# Patient Record
Sex: Male | Born: 1935 | Race: White | Hispanic: No | Smoking: Former smoker
Health system: Southern US, Community
[De-identification: ages and names within clinical notes are randomized; demographics above are authoritative.]

## PROBLEM LIST (undated history)

## (undated) DIAGNOSIS — J449 Chronic obstructive pulmonary disease, unspecified: Secondary | ICD-10-CM

## (undated) DIAGNOSIS — I429 Cardiomyopathy, unspecified: Secondary | ICD-10-CM

## (undated) DIAGNOSIS — N400 Enlarged prostate without lower urinary tract symptoms: Secondary | ICD-10-CM

## (undated) DIAGNOSIS — E119 Type 2 diabetes mellitus without complications: Secondary | ICD-10-CM

## (undated) DIAGNOSIS — I2510 Atherosclerotic heart disease of native coronary artery without angina pectoris: Secondary | ICD-10-CM

## (undated) HISTORY — PX: HERNIA REPAIR: SHX51

## (undated) HISTORY — PX: BACK SURGERY: SHX140

## (undated) HISTORY — PX: CHOLECYSTECTOMY: SHX55

---

## 2013-12-23 ENCOUNTER — Inpatient Hospital Stay (HOSPITAL_COMMUNITY)
Admission: EM | Admit: 2013-12-23 | Discharge: 2014-01-02 | DRG: 190 | Disposition: A | Discharge status: Home / Self Care | Payer: Medicare Other | Attending: Internal Medicine | Admitting: Internal Medicine

## 2013-12-23 ENCOUNTER — Encounter (HOSPITAL_COMMUNITY): Payer: Self-pay | Admitting: Emergency Medicine

## 2013-12-23 ENCOUNTER — Emergency Department (HOSPITAL_COMMUNITY): Payer: Medicare Other

## 2013-12-23 DIAGNOSIS — J9601 Acute respiratory failure with hypoxia: Secondary | ICD-10-CM

## 2013-12-23 DIAGNOSIS — I252 Old myocardial infarction: Secondary | ICD-10-CM

## 2013-12-23 DIAGNOSIS — E872 Acidosis, unspecified: Secondary | ICD-10-CM | POA: Diagnosis present

## 2013-12-23 DIAGNOSIS — D649 Anemia, unspecified: Secondary | ICD-10-CM | POA: Diagnosis present

## 2013-12-23 DIAGNOSIS — J111 Influenza due to unidentified influenza virus with other respiratory manifestations: Secondary | ICD-10-CM | POA: Diagnosis present

## 2013-12-23 DIAGNOSIS — R7989 Other specified abnormal findings of blood chemistry: Secondary | ICD-10-CM

## 2013-12-23 DIAGNOSIS — F3289 Other specified depressive episodes: Secondary | ICD-10-CM | POA: Diagnosis present

## 2013-12-23 DIAGNOSIS — R197 Diarrhea, unspecified: Secondary | ICD-10-CM | POA: Diagnosis present

## 2013-12-23 DIAGNOSIS — Z87891 Personal history of nicotine dependence: Secondary | ICD-10-CM

## 2013-12-23 DIAGNOSIS — E876 Hypokalemia: Secondary | ICD-10-CM | POA: Diagnosis present

## 2013-12-23 DIAGNOSIS — J441 Chronic obstructive pulmonary disease with (acute) exacerbation: Secondary | ICD-10-CM

## 2013-12-23 DIAGNOSIS — J9600 Acute respiratory failure, unspecified whether with hypoxia or hypercapnia: Secondary | ICD-10-CM | POA: Diagnosis present

## 2013-12-23 DIAGNOSIS — J449 Chronic obstructive pulmonary disease, unspecified: Secondary | ICD-10-CM

## 2013-12-23 DIAGNOSIS — F32A Depression, unspecified: Secondary | ICD-10-CM

## 2013-12-23 DIAGNOSIS — IMO0001 Reserved for inherently not codable concepts without codable children: Secondary | ICD-10-CM | POA: Diagnosis present

## 2013-12-23 DIAGNOSIS — R0902 Hypoxemia: Secondary | ICD-10-CM

## 2013-12-23 DIAGNOSIS — E119 Type 2 diabetes mellitus without complications: Secondary | ICD-10-CM

## 2013-12-23 DIAGNOSIS — J101 Influenza due to other identified influenza virus with other respiratory manifestations: Secondary | ICD-10-CM | POA: Diagnosis present

## 2013-12-23 DIAGNOSIS — I10 Essential (primary) hypertension: Secondary | ICD-10-CM

## 2013-12-23 DIAGNOSIS — I493 Ventricular premature depolarization: Secondary | ICD-10-CM

## 2013-12-23 DIAGNOSIS — E785 Hyperlipidemia, unspecified: Secondary | ICD-10-CM | POA: Diagnosis present

## 2013-12-23 DIAGNOSIS — I509 Heart failure, unspecified: Secondary | ICD-10-CM | POA: Diagnosis present

## 2013-12-23 DIAGNOSIS — N400 Enlarged prostate without lower urinary tract symptoms: Secondary | ICD-10-CM | POA: Diagnosis present

## 2013-12-23 DIAGNOSIS — J209 Acute bronchitis, unspecified: Principal | ICD-10-CM | POA: Diagnosis present

## 2013-12-23 DIAGNOSIS — I428 Other cardiomyopathies: Secondary | ICD-10-CM | POA: Diagnosis present

## 2013-12-23 DIAGNOSIS — Z7982 Long term (current) use of aspirin: Secondary | ICD-10-CM

## 2013-12-23 DIAGNOSIS — E1165 Type 2 diabetes mellitus with hyperglycemia: Secondary | ICD-10-CM

## 2013-12-23 DIAGNOSIS — F329 Major depressive disorder, single episode, unspecified: Secondary | ICD-10-CM | POA: Diagnosis present

## 2013-12-23 DIAGNOSIS — R778 Other specified abnormalities of plasma proteins: Secondary | ICD-10-CM | POA: Diagnosis present

## 2013-12-23 DIAGNOSIS — J440 Chronic obstructive pulmonary disease with acute lower respiratory infection: Principal | ICD-10-CM | POA: Diagnosis present

## 2013-12-23 DIAGNOSIS — I169 Hypertensive crisis, unspecified: Secondary | ICD-10-CM | POA: Diagnosis present

## 2013-12-23 DIAGNOSIS — D696 Thrombocytopenia, unspecified: Secondary | ICD-10-CM | POA: Diagnosis present

## 2013-12-23 DIAGNOSIS — I5043 Acute on chronic combined systolic (congestive) and diastolic (congestive) heart failure: Secondary | ICD-10-CM | POA: Diagnosis not present

## 2013-12-23 DIAGNOSIS — T380X5A Adverse effect of glucocorticoids and synthetic analogues, initial encounter: Secondary | ICD-10-CM | POA: Diagnosis present

## 2013-12-23 DIAGNOSIS — R0602 Shortness of breath: Secondary | ICD-10-CM

## 2013-12-23 HISTORY — DX: Cardiomyopathy, unspecified: I42.9

## 2013-12-23 HISTORY — DX: Benign prostatic hyperplasia without lower urinary tract symptoms: N40.0

## 2013-12-23 HISTORY — DX: Atherosclerotic heart disease of native coronary artery without angina pectoris: I25.10

## 2013-12-23 HISTORY — DX: Type 2 diabetes mellitus without complications: E11.9

## 2013-12-23 HISTORY — DX: Chronic obstructive pulmonary disease, unspecified: J44.9

## 2013-12-23 LAB — CBC WITH DIFFERENTIAL/PLATELET
Basophils Absolute: 0 10*3/uL (ref 0.0–0.1)
Basophils Relative: 0 % (ref 0–1)
Eosinophils Absolute: 0 10*3/uL (ref 0.0–0.7)
Eosinophils Relative: 0 % (ref 0–5)
HEMATOCRIT: 46 % (ref 39.0–52.0)
Hemoglobin: 15.3 g/dL (ref 13.0–17.0)
LYMPHS ABS: 0.9 10*3/uL (ref 0.7–4.0)
Lymphocytes Relative: 12 % (ref 12–46)
MCH: 28.3 pg (ref 26.0–34.0)
MCHC: 33.3 g/dL (ref 30.0–36.0)
MCV: 85.2 fL (ref 78.0–100.0)
MONOS PCT: 5 % (ref 3–12)
Monocytes Absolute: 0.3 10*3/uL (ref 0.1–1.0)
NEUTROS ABS: 6.1 10*3/uL (ref 1.7–7.7)
NEUTROS PCT: 84 % — AB (ref 43–77)
Platelets: 140 10*3/uL — ABNORMAL LOW (ref 150–400)
RBC: 5.4 MIL/uL (ref 4.22–5.81)
RDW: 12.8 % (ref 11.5–15.5)
WBC: 7.4 10*3/uL (ref 4.0–10.5)

## 2013-12-23 LAB — COMPREHENSIVE METABOLIC PANEL
ALBUMIN: 3.7 g/dL (ref 3.5–5.2)
ALT: 14 U/L (ref 0–53)
AST: 19 U/L (ref 0–37)
Alkaline Phosphatase: 81 U/L (ref 39–117)
BILIRUBIN TOTAL: 0.5 mg/dL (ref 0.3–1.2)
BUN: 14 mg/dL (ref 6–23)
CHLORIDE: 99 meq/L (ref 96–112)
CO2: 26 mEq/L (ref 19–32)
Calcium: 9.4 mg/dL (ref 8.4–10.5)
Creatinine, Ser: 0.78 mg/dL (ref 0.50–1.35)
GFR, EST NON AFRICAN AMERICAN: 84 mL/min — AB (ref >90)
GLUCOSE: 302 mg/dL — AB (ref 70–99)
Potassium: 4.4 mEq/L (ref 3.7–5.3)
Sodium: 142 mEq/L (ref 137–147)
Total Protein: 7.7 g/dL (ref 6.0–8.3)

## 2013-12-23 LAB — TROPONIN I: TROPONIN I: 0.59 ng/mL — AB (ref <0.30)

## 2013-12-23 LAB — GLUCOSE, CAPILLARY: Glucose-Capillary: 263 mg/dL — ABNORMAL HIGH (ref 70–99)

## 2013-12-23 LAB — CG4 I-STAT (LACTIC ACID): LACTIC ACID, VENOUS: 3.85 mmol/L — AB (ref 0.5–2.2)

## 2013-12-23 LAB — POCT I-STAT TROPONIN I: Troponin i, poc: 0.21 ng/mL (ref 0.00–0.08)

## 2013-12-23 MED ORDER — ALBUTEROL (5 MG/ML) CONTINUOUS INHALATION SOLN
20.0000 mg | INHALATION_SOLUTION | Freq: Once | RESPIRATORY_TRACT | Status: AC
Start: 2013-12-23 — End: 2013-12-23
  Administered 2013-12-23: 20 mg via RESPIRATORY_TRACT

## 2013-12-23 MED ORDER — INSULIN ASPART 100 UNIT/ML ~~LOC~~ SOLN
5.0000 [IU] | Freq: Once | SUBCUTANEOUS | Status: AC
  Administered 2013-12-23: 5 [IU] via SUBCUTANEOUS
  Filled 2013-12-23: qty 1

## 2013-12-23 MED ORDER — SODIUM CHLORIDE 0.9 % IV BOLUS (SEPSIS)
2000.0000 mL | Freq: Once | INTRAVENOUS | Status: AC
  Administered 2013-12-23: 2000 mL via INTRAVENOUS

## 2013-12-23 MED ORDER — ALBUTEROL (5 MG/ML) CONTINUOUS INHALATION SOLN
INHALATION_SOLUTION | RESPIRATORY_TRACT | Status: AC
  Filled 2013-12-23: qty 20

## 2013-12-23 MED ORDER — SODIUM CHLORIDE 0.9 % IV BOLUS (SEPSIS)
500.0000 mL | Freq: Once | INTRAVENOUS | Status: AC
  Administered 2013-12-23: 500 mL via INTRAVENOUS

## 2013-12-23 MED ORDER — IPRATROPIUM BROMIDE 0.02 % IN SOLN
0.5000 mg | Freq: Once | RESPIRATORY_TRACT | Status: AC
  Administered 2013-12-23: 0.5 mg via RESPIRATORY_TRACT
  Filled 2013-12-23: qty 2.5

## 2013-12-23 MED ORDER — METHYLPREDNISOLONE SODIUM SUCC 125 MG IJ SOLR
125.0000 mg | Freq: Once | INTRAMUSCULAR | Status: AC
  Administered 2013-12-23: 125 mg via INTRAVENOUS
  Filled 2013-12-23: qty 2

## 2013-12-23 NOTE — ED Notes (Signed)
Continuous neb treatment finished, aerosol mask removed and patient placed on 3 liters of oxygen via nasal canula.

## 2013-12-23 NOTE — H&P (Addendum)
[facility] History and Physical  Seth Hunt WOE:[id number] DOB: 16-Aug-1933 DOA: 12/23/2013  Referring physician:  PCP: [name], MD  Specialists:   Chief Complaint: SOB, cough   HPI: Seth Hunt is a 80 y.o. male with DM, HTN, HPL, COPD presented with SOB, non productive cough, associated with DOE, chills after recent URI symptoms; patient found to have mild hypoxia in ED which improved after inhalers+oxygen; denies any chest pain, no palpitations, no dizziness, no diaphoresis, no nausea, vomiting or diarrhea;   Review of Systems: The patient denies anorexia, fever, weight loss,, vision loss, decreased hearing, hoarseness, chest pain, syncope,  balance deficits, hemoptysis, abdominal pain, melena, hematochezia, severe indigestion/heartburn, hematuria, incontinence, genital sores, muscle weakness, suspicious skin lesions, transient blindness, difficulty walking, depression, unusual weight change, abnormal bleeding, enlarged lymph nodes, angioedema, and breast masses.    Past Medical History  Diagnosis Date  . COPD (chronic obstructive pulmonary disease)   . Diabetes mellitus without complication    Past Surgical History  Procedure Laterality Date  . Back surgery    . Cholecystectomy    . Hernia repair     Social History:  reports that he has quit smoking. He does not have any smokeless tobacco history on file. He reports that he does not drink alcohol or use illicit drugs. Home;  where does patient live--home, ALF, SNF? and with whom if at home? Yes;  Can patient participate in ADLs?  No Known Allergies  No family history on file. no h/o CAD (be sure to complete)  Prior to Admission medications   Medication Sig Start Date End Date Taking? Authorizing Provider  aspirin EC 81 MG tablet Take 81 mg by mouth daily.   Yes Historical Provider, MD  FLUoxetine (PROZAC) 20 MG capsule Take 20 mg by mouth every morning.   Yes Historical Provider, MD  glimepiride (AMARYL) 4 MG  tablet Take 4 mg by mouth daily.   Yes Historical Provider, MD  metFORMIN (GLUCOPHAGE) 1000 MG tablet Take 1,000 mg by mouth 2 (two) times daily.   Yes Historical Provider, MD  metoprolol tartrate (LOPRESSOR) 25 MG tablet Take 25 mg by mouth 2 (two) times daily.   Yes Historical Provider, MD  ramipril (ALTACE) 10 MG capsule Take 10 mg by mouth daily.   Yes Historical Provider, MD  simvastatin (ZOCOR) 40 MG tablet Take 40 mg by mouth at bedtime.   Yes Historical Provider, MD  tamsulosin (FLOMAX) 0.4 MG CAPS capsule Take 0.4 mg by mouth every morning.    Yes Historical Provider, MD   Physical Exam: Filed Vitals:   12/23/13 2100  BP: 135/65  Pulse: 104  Temp:   Resp: 26     General:  alert  Eyes: EOM-I, perrla   ENT: no oral ulcers   Neck: supple, no JVD  Cardiovascular: s1,s2 rrr  Respiratory: LL wheezing, poor ventilation LL  Abdomen: soft, nt, nd   Skin: no rash  Musculoskeletal: no LE edema  Psychiatric: no hallucinations   Neurologic: CN 2-12 intact, motor 5/5 BL  Labs on Admission:  Basic Metabolic Panel:  Recent Labs Lab 12/23/13 1914  NA 142  K 4.4  CL 99  CO2 26  GLUCOSE 302*  BUN 14  CREATININE 0.78  CALCIUM 9.4   Liver Function Tests:  Recent Labs Lab 12/23/13 1914  AST 19  ALT 14  ALKPHOS 81  BILITOT 0.5  PROT 7.7  ALBUMIN 3.7   No results found for this basename: LIPASE, AMYLASE,  in the last 168  hours No results found for this basename: AMMONIA,  in the last 168 hours CBC:  Recent Labs Lab 12/23/13 1914  WBC 7.4  NEUTROABS 6.1  HGB 15.3  HCT 46.0  MCV 85.2  PLT 140*   Cardiac Enzymes: No results found for this basename: CKTOTAL, CKMB, CKMBINDEX, TROPONINI,  in the last 168 hours  BNP (last 3 results) No results found for this basename: PROBNP,  in the last 8760 hours CBG:  Recent Labs Lab 12/23/13 2140  GLUCAP 263*    Radiological Exams on Admission: Dg Chest Port 1 View  12/23/2013   CLINICAL DATA:  Shortness  of breath  EXAM: PORTABLE CHEST - 1 VIEW  COMPARISON:  None.  FINDINGS: The cardiac shadow is within normal limits. The lungs are well aerated bilaterally. No focal infiltrate or sizable effusion is seen.  IMPRESSION: No acute abnormality noted.   Electronically Signed   By: Alcide CleverMark  Lukens M.D.   On: 12/23/2013 19:09    EKG: Independently reviewed. probable MAT on initial ECG then NSR, non specifics t wave changes    Assessment/Plan Active Problems:   COPD (chronic obstructive pulmonary disease)   DM (diabetes mellitus)   HTN (hypertension)   SOB (shortness of breath)  80 y.o. male with DM, HTN, HPL, COPD presented with SOB, non productive cough, associated with DOE, chills after recent URI symptoms;  1. COPD exacerbation; CXR: no clear infiltrate; symptoms improved after inhalers+oxygen -started IV steroids, ATX, bronchodilators, oxygen;   2. Positive initial trop; no active chest pain r/o NSTEMI  -per patient LHC (2014): no coronary artery disease at [facility]  -monitor tele r/o arrythmia, start IV hep, cont ASA, BB, statins; obtain echo; if trop neg d/c heparin; need cardiology eval;'  -obtain records from [facility]   3. HTN resume home meds; titrate per response   4. DM no recent HA1C;  -hold PO meds; ISS while inpatient; check HA1C   None;  if consultant consulted, please document name and whether formally or informally consulted  Code Status: full;'  (must indicate code status--if unknown or must be presumed, indicate so) Family Communication: d/w patient (indicate person spoken with, if applicable, with phone number if by telephone) Disposition Plan: home 24-48 hours if stable  (indicate anticipated LOS)  Time spent: >35 minutes   Esperanza SheetsBURIEV, Lillie Bollig N [facility] Pager [phone]  If 7PM-7AM, please contact night-coverage [email or web address] Password [email or web address] 12/23/2013, 10:43 PM

## 2013-12-23 NOTE — ED Notes (Signed)
CRITICAL VALUE ALERT  Critical value received:  Troponin 0.21  Date of notification:  12/23/13  Time of notification:  2004  Critical value read back:yes  Nurse who received alert:  Kathlene Cote, RN  MD notified (1st page):  Dr. Fonnie Jarvis

## 2013-12-23 NOTE — ED Notes (Signed)
Pt has COPD, woke up this morning feeling slightly short of breath, worsened over the day.

## 2013-12-23 NOTE — ED Notes (Signed)
CRITICAL VALUE ALERT  Critical value received:  Troponin 0.59  Date of notification:  12/23/13  Time of notification:  2346  Critical value read back:yes  Nurse who received alert:  bkn  MD notified (1st page): Bur[place]  Time of first page:  2347  MD notified (2nd page):  Time of second page:  Responding MD:    Time MD responded:

## 2013-12-23 NOTE — Progress Notes (Signed)
[facility] PROGRESS NOTE  Valin Hartsel DGL:[id number] DOB: [birth year] DOA: 12/23/2013 PCP: [name], MD  Second trop 0.5; no active chest pain, cont BB,ASA, statin, IV heparin; -TF to [facility], consulted cardiology d/w Dr. Carlena Bjornstad  [facility] Pager [phone]. If 7PM-7AM, please contact night-coverage at [email or web address], password [facility] 12/24/2013, 12:00 AM  LOS: 1 day

## 2013-12-23 NOTE — ED Notes (Signed)
Dr. Fonnie Jarvis notified of troponin of 0.21.

## 2013-12-23 NOTE — ED Provider Notes (Signed)
CSN: [id number]     Arrival date & time 12/23/13  1829 History   First MD Initiated Contact with Patient 12/23/13 1840     Chief Complaint  Patient presents with  . Shortness of Breath   (Consider location/radiation/quality/duration/timing/severity/associated sxs/prior Treatment) HPI 80 year old male with COPD not on inhalers has chronic shortness of breath not on home O2 presents with one day worse than usual shortness of breath with chills and cough but no confusion no rash no chest pain no abdominal pain no vomiting no diarrhea no body aches he is moderately short of breath with hypoxia on room air 87% pulse oximetry is no treatment prior to arrival. Past Medical History  Diagnosis Date  . COPD (chronic obstructive pulmonary disease)   . Diabetes mellitus without complication   . BPH (benign prostatic hyperplasia)    Past Surgical History  Procedure Laterality Date  . Back surgery    . Cholecystectomy    . Hernia repair      bilateral inguinal hernia   Family History  Problem Relation Age of Onset  . CAD Father 11  . CAD Mother 38    Lived until age 49   History  Substance Use Topics  . Smoking status: Former Smoker -- 1.00 packs/day for 20 years    Types: Cigarettes  . Smokeless tobacco: Not on file     Comment: Quit 20 years ago  . Alcohol Use: No    Review of Systems 10 Systems reviewed and are negative for acute change except as noted in the HPI. Allergies  Review of patient's allergies indicates no known allergies.  Home Medications   No current outpatient prescriptions on file. BP 137/61  Pulse 75  Temp(Src) 98 F (36.7 C) (Oral)  Resp 18  Ht 5\' 8"  (1.727 m)  Wt 190 lb 11.2 oz (86.5 kg)  BMI 29.00 kg/m2  SpO2 98% Physical Exam  Nursing note and vitals reviewed. Constitutional:  Awake, alert, nontoxic appearance.  HENT:  Head: Atraumatic.  Eyes: Right eye exhibits no discharge. Left eye exhibits no discharge.  Neck: Neck supple.  Cardiovascular:   No murmur heard. Tachycardic irregular rhythm  Pulmonary/Chest: He is in respiratory distress. He has wheezes. He has no rales. He exhibits no tenderness.  Moderate respiratory distress speaks short sentences hypoxia with pulse oximetry 87% room air with diffuse wheezes no crackles no rhonchi does have mild retractions mild accessory muscle usage  Abdominal: Soft. There is no tenderness. There is no rebound.  Musculoskeletal: He exhibits no edema and no tenderness.  Baseline ROM, no obvious new focal weakness.  Neurological:  Mental status and motor strength appears baseline for patient and situation.  Skin: No rash noted.  Psychiatric: He has a normal mood and affect.    ED Course  Procedures (including critical care time) Looks and feels much better speaking full sentences now no retractions no excess or muscle usage only mild scattered wheezes pulse oximetry 100% on nasal cannula oxygen now resting comfortably [facility] paged. 2110 Patient informed of clinical course, understand medical decision-making process, and agree with plan. [facility] was to admit at [facility] but repeat troponin rising so [facility] arranging [facility] to [facility]. Labs Review Labs Reviewed  CBC WITH DIFFERENTIAL - Abnormal; Notable for the following:    Platelets 140 (*)    Neutrophils Relative % 84 (*)    All other components within normal limits  COMPREHENSIVE METABOLIC PANEL - Abnormal; Notable for the following:    Glucose, Bld 302 (*)  GFR calc non Af Amer 84 (*)    All other components within normal limits  GLUCOSE, CAPILLARY - Abnormal; Notable for the following:    Glucose-Capillary 263 (*)    All other components within normal limits  TROPONIN I - Abnormal; Notable for the following:    Troponin I 0.59 (*)    All other components within normal limits  GLUCOSE, CAPILLARY - Abnormal; Notable for the following:    Glucose-Capillary 287 (*)    All other components within normal limits  INFLUENZA PANEL BY PCR (TYPE A &  B, H1N1) - Abnormal; Notable for the following:    Influenza A By PCR POSITIVE (*)    All other components within normal limits  CBC - Abnormal; Notable for the following:    Hemoglobin 12.3 (*)    HCT 35.5 (*)    Platelets 117 (*)    All other components within normal limits  BASIC METABOLIC PANEL - Abnormal; Notable for the following:    Glucose, Bld 297 (*)    GFR calc non Af Amer 89 (*)    All other components within normal limits  GLUCOSE, CAPILLARY - Abnormal; Notable for the following:    Glucose-Capillary 231 (*)    All other components within normal limits  GLUCOSE, CAPILLARY - Abnormal; Notable for the following:    Glucose-Capillary 210 (*)    All other components within normal limits  CG4 I-STAT (LACTIC ACID) - Abnormal; Notable for the following:    Lactic Acid, Venous 3.85 (*)    All other components within normal limits  POCT I-STAT TROPONIN I - Abnormal; Notable for the following:    Troponin i, poc 0.21 (*)    All other components within normal limits  TROPONIN I  TROPONIN I  HEMOGLOBIN A1C  HEPARIN LEVEL (UNFRACTIONATED)   Imaging Review Dg Chest Port 1 View  12/23/2013   CLINICAL DATA:  Shortness of breath  EXAM: PORTABLE CHEST - 1 VIEW  COMPARISON:  None.  FINDINGS: The cardiac shadow is within normal limits. The lungs are well aerated bilaterally. No focal infiltrate or sizable effusion is seen.  IMPRESSION: No acute abnormality noted.   Electronically Signed   By: Alcide CleverMark  Lukens M.D.   On: 12/23/2013 19:09    EKG Interpretation    Date/Time:  Saturday December 23 2013 21:56:41 EST Ventricular Rate:  101 PR Interval:  182 QRS Duration: 94 QT Interval:  370 QTC Calculation: 479 R Axis:   81 Text Interpretation:  Sinus tachycardia with Premature atrial complexes with Abberant conduction Possible Anterior infarct (cited on or before 23-Dec-2013) When compared with ECG of 23-Dec-2013 18:43, Sinus rhythm has replaced Atrial fibrillation Nonspecific T wave  abnormality, improved in Lateral leads Confirmed by [facility]  MD, Particia Strahm ([id number]) on 12/24/2013 1:15:17 AM            MDM   1. COPD (chronic obstructive pulmonary disease)   2. DM (diabetes mellitus)   3. HTN (hypertension)   4. SOB (shortness of breath)   5. COPD with acute exacerbation   6. Hypoxia   7. Elevated troponin   8. Troponin level elevated    The patient appears reasonably stabilized for admission/transfer considering the current resources, flow, and capabilities available in the ED at this time, and I doubt any other [facility] requiring further screening and/or treatment in the ED prior to admission.    Hurman HornJohn M Jermari Tamargo, MD 12/24/13 1314

## 2013-12-24 ENCOUNTER — Encounter (HOSPITAL_COMMUNITY): Payer: Self-pay | Admitting: *Deleted

## 2013-12-24 DIAGNOSIS — R0902 Hypoxemia: Secondary | ICD-10-CM

## 2013-12-24 DIAGNOSIS — J111 Influenza due to unidentified influenza virus with other respiratory manifestations: Secondary | ICD-10-CM

## 2013-12-24 DIAGNOSIS — J449 Chronic obstructive pulmonary disease, unspecified: Secondary | ICD-10-CM

## 2013-12-24 DIAGNOSIS — F329 Major depressive disorder, single episode, unspecified: Secondary | ICD-10-CM

## 2013-12-24 DIAGNOSIS — I517 Cardiomegaly: Secondary | ICD-10-CM

## 2013-12-24 DIAGNOSIS — J101 Influenza due to other identified influenza virus with other respiratory manifestations: Secondary | ICD-10-CM | POA: Diagnosis present

## 2013-12-24 DIAGNOSIS — J4489 Other specified chronic obstructive pulmonary disease: Secondary | ICD-10-CM

## 2013-12-24 DIAGNOSIS — R778 Other specified abnormalities of plasma proteins: Secondary | ICD-10-CM | POA: Diagnosis present

## 2013-12-24 DIAGNOSIS — R7989 Other specified abnormal findings of blood chemistry: Secondary | ICD-10-CM

## 2013-12-24 DIAGNOSIS — D696 Thrombocytopenia, unspecified: Secondary | ICD-10-CM

## 2013-12-24 DIAGNOSIS — F3289 Other specified depressive episodes: Secondary | ICD-10-CM

## 2013-12-24 DIAGNOSIS — F32A Depression, unspecified: Secondary | ICD-10-CM | POA: Diagnosis present

## 2013-12-24 LAB — GLUCOSE, CAPILLARY
GLUCOSE-CAPILLARY: 229 mg/dL — AB (ref 70–99)
GLUCOSE-CAPILLARY: 202 mg/dL — AB (ref 70–99)
Glucose-Capillary: 287 mg/dL — ABNORMAL HIGH (ref 70–99)
Glucose-Capillary: 231 mg/dL — ABNORMAL HIGH (ref 70–99)
Glucose-Capillary: 210 mg/dL — ABNORMAL HIGH (ref 70–99)

## 2013-12-24 LAB — BASIC METABOLIC PANEL
BUN: 12 mg/dL (ref 6–23)
CO2: 20 mEq/L (ref 19–32)
CREATININE: 0.68 mg/dL (ref 0.50–1.35)
Calcium: 8.4 mg/dL (ref 8.4–10.5)
Chloride: 104 mEq/L (ref 96–112)
GFR, EST NON AFRICAN AMERICAN: 89 mL/min — AB (ref >90)
Glucose, Bld: 297 mg/dL — ABNORMAL HIGH (ref 70–99)
POTASSIUM: 4 meq/L (ref 3.7–5.3)
Sodium: 140 mEq/L (ref 137–147)

## 2013-12-24 LAB — CBC
HCT: 35.5 % — ABNORMAL LOW (ref 39.0–52.0)
Hemoglobin: 12.3 g/dL — ABNORMAL LOW (ref 13.0–17.0)
MCH: 28.9 pg (ref 26.0–34.0)
MCHC: 34.6 g/dL (ref 30.0–36.0)
MCV: 83.3 fL (ref 78.0–100.0)
Platelets: 117 10*3/uL — ABNORMAL LOW (ref 150–400)
RBC: 4.26 MIL/uL (ref 4.22–5.81)
RDW: 12.7 % (ref 11.5–15.5)
WBC: 7.4 10*3/uL (ref 4.0–10.5)

## 2013-12-24 LAB — HEPARIN LEVEL (UNFRACTIONATED): Heparin Unfractionated: <0.1 IU/mL — ABNORMAL LOW (ref 0.30–0.70)

## 2013-12-24 LAB — INFLUENZA PANEL BY PCR (TYPE A & B)
H1N1FLUPCR: NOT DETECTED
INFLBPCR: NEGATIVE
Influenza A By PCR: POSITIVE — AB

## 2013-12-24 LAB — HEMOGLOBIN A1C
Hgb A1c MFr Bld: 6.8 % — ABNORMAL HIGH (ref <5.7)
MEAN PLASMA GLUCOSE: 148 mg/dL — AB (ref <117)

## 2013-12-24 MED ORDER — HEPARIN BOLUS VIA INFUSION
4000.0000 [IU] | Freq: Once | INTRAVENOUS | Status: AC
  Administered 2013-12-24: 4000 [IU] via INTRAVENOUS
  Filled 2013-12-24: qty 4000

## 2013-12-24 MED ORDER — ENOXAPARIN SODIUM 40 MG/0.4ML ~~LOC~~ SOLN
40.0000 mg | Freq: Every day | SUBCUTANEOUS | Status: DC
  Administered 2013-12-24 – 2014-01-02 (×9): 40 mg via SUBCUTANEOUS
  Filled 2013-12-24 (×10): qty 0.4

## 2013-12-24 MED ORDER — GLIMEPIRIDE 4 MG PO TABS
4.0000 mg | ORAL_TABLET | Freq: Every day | ORAL | Status: DC
  Administered 2013-12-25 – 2014-01-02 (×9): 4 mg via ORAL
  Filled 2013-12-24 (×10): qty 1

## 2013-12-24 MED ORDER — METOPROLOL TARTRATE 25 MG PO TABS
25.0000 mg | ORAL_TABLET | Freq: Two times a day (BID) | ORAL | Status: DC
  Administered 2013-12-24 – 2013-12-27 (×7): 25 mg via ORAL
  Filled 2013-12-24 (×8): qty 1

## 2013-12-24 MED ORDER — HEPARIN (PORCINE) IN NACL 100-0.45 UNIT/ML-% IJ SOLN
1000.0000 [IU]/h | INTRAMUSCULAR | Status: DC
  Administered 2013-12-24: 1000 [IU]/h via INTRAVENOUS
  Filled 2013-12-24: qty 250

## 2013-12-24 MED ORDER — FLUOXETINE HCL 20 MG PO CAPS
20.0000 mg | ORAL_CAPSULE | Freq: Every morning | ORAL | Status: DC
  Administered 2013-12-24 – 2014-01-02 (×10): 20 mg via ORAL
  Filled 2013-12-24 (×10): qty 1

## 2013-12-24 MED ORDER — DM-GUAIFENESIN ER 30-600 MG PO TB12
1.0000 | ORAL_TABLET | Freq: Two times a day (BID) | ORAL | Status: DC
  Administered 2013-12-24 – 2014-01-02 (×19): 1 via ORAL
  Filled 2013-12-24 (×20): qty 1

## 2013-12-24 MED ORDER — RAMIPRIL 10 MG PO CAPS
10.0000 mg | ORAL_CAPSULE | Freq: Every day | ORAL | Status: DC
  Administered 2013-12-24 – 2014-01-02 (×10): 10 mg via ORAL
  Filled 2013-12-24 (×10): qty 1

## 2013-12-24 MED ORDER — METHYLPREDNISOLONE SODIUM SUCC 125 MG IJ SOLR
60.0000 mg | Freq: Four times a day (QID) | INTRAMUSCULAR | Status: DC
  Administered 2013-12-24 (×2): 60 mg via INTRAVENOUS
  Filled 2013-12-24 (×5): qty 0.96

## 2013-12-24 MED ORDER — ALBUTEROL SULFATE (2.5 MG/3ML) 0.083% IN NEBU: 2.5000 mg | INHALATION_SOLUTION | RESPIRATORY_TRACT | Status: DC | PRN

## 2013-12-24 MED ORDER — SODIUM CHLORIDE 0.9 % IJ SOLN
3.0000 mL | Freq: Two times a day (BID) | INTRAMUSCULAR | Status: DC
  Administered 2013-12-24 – 2014-01-02 (×15): 3 mL via INTRAVENOUS

## 2013-12-24 MED ORDER — ACETAMINOPHEN 325 MG PO TABS: 650.0000 mg | ORAL_TABLET | Freq: Four times a day (QID) | ORAL | Status: DC | PRN

## 2013-12-24 MED ORDER — INSULIN ASPART 100 UNIT/ML ~~LOC~~ SOLN
0.0000 [IU] | Freq: Three times a day (TID) | SUBCUTANEOUS | Status: DC
  Administered 2013-12-24: 3 [IU] via SUBCUTANEOUS
  Administered 2013-12-24: 2 [IU] via SUBCUTANEOUS
  Administered 2013-12-24: 3 [IU] via SUBCUTANEOUS
  Administered 2013-12-25: 5 [IU] via SUBCUTANEOUS

## 2013-12-24 MED ORDER — LEVOFLOXACIN IN D5W 750 MG/150ML IV SOLN
750.0000 mg | Freq: Once | INTRAVENOUS | Status: AC
  Administered 2013-12-24: 750 mg via INTRAVENOUS
  Filled 2013-12-24: qty 150

## 2013-12-24 MED ORDER — ONDANSETRON HCL 4 MG/2ML IJ SOLN: 4.0000 mg | Freq: Four times a day (QID) | INTRAMUSCULAR | Status: DC | PRN

## 2013-12-24 MED ORDER — ONDANSETRON HCL 4 MG PO TABS: 4.0000 mg | ORAL_TABLET | Freq: Four times a day (QID) | ORAL | Status: DC | PRN

## 2013-12-24 MED ORDER — TIOTROPIUM BROMIDE MONOHYDRATE 18 MCG IN CAPS
18.0000 ug | ORAL_CAPSULE | Freq: Every day | RESPIRATORY_TRACT | Status: DC
  Administered 2013-12-24 – 2013-12-25 (×2): 18 ug via RESPIRATORY_TRACT
  Filled 2013-12-24: qty 5

## 2013-12-24 MED ORDER — SIMVASTATIN 40 MG PO TABS
40.0000 mg | ORAL_TABLET | Freq: Every day | ORAL | Status: DC
  Administered 2013-12-24 – 2013-12-26 (×3): 40 mg via ORAL
  Filled 2013-12-24 (×4): qty 1

## 2013-12-24 MED ORDER — OSELTAMIVIR PHOSPHATE 75 MG PO CAPS
75.0000 mg | ORAL_CAPSULE | Freq: Two times a day (BID) | ORAL | Status: AC
  Administered 2013-12-24 – 2013-12-28 (×10): 75 mg via ORAL
  Filled 2013-12-24 (×10): qty 1

## 2013-12-24 MED ORDER — ACETAMINOPHEN 650 MG RE SUPP: 650.0000 mg | Freq: Four times a day (QID) | RECTAL | Status: DC | PRN

## 2013-12-24 MED ORDER — LEVOFLOXACIN IN D5W 750 MG/150ML IV SOLN
750.0000 mg | Freq: Every day | INTRAVENOUS | Status: DC
  Filled 2013-12-24: qty 150

## 2013-12-24 MED ORDER — TAMSULOSIN HCL 0.4 MG PO CAPS
0.4000 mg | ORAL_CAPSULE | Freq: Every morning | ORAL | Status: DC
  Administered 2013-12-24 – 2014-01-02 (×10): 0.4 mg via ORAL
  Filled 2013-12-24 (×10): qty 1

## 2013-12-24 MED ORDER — ASPIRIN EC 81 MG PO TBEC
81.0000 mg | DELAYED_RELEASE_TABLET | Freq: Every day | ORAL | Status: DC
  Administered 2013-12-24 – 2014-01-02 (×11): 81 mg via ORAL
  Filled 2013-12-24 (×10): qty 1

## 2013-12-24 NOTE — Progress Notes (Signed)
ANTICOAGULATION CONSULT NOTE - Initial Consult  Pharmacy Consult for Heparin  Indication: chest pain/ACS  No Known Allergies  Patient Measurements: Height: 5\' 8"  (172.7 cm) Weight: 190 lb 11.2 oz (86.5 kg) IBW/kg (Calculated) : 68.4 Heparin Dosing Weight: 85 kg  Vital Signs: Temp: 98 F (36.7 C) (01/11 0451) Temp src: Oral (01/11 0451) BP: 137/61 mmHg (01/11 0451) Pulse Rate: 77 (01/11 0451)  Labs:  Recent Labs  12/23/13 1914 12/23/13 2306  HGB 15.3  --   HCT 46.0  --   PLT 140*  --   CREATININE 0.78  --   TROPONINI  --  0.59*    Estimated Creatinine Clearance: 81.4 ml/min (by C-G formula based on Cr of 0.78).   Medical History: Past Medical History  Diagnosis Date  . COPD (chronic obstructive pulmonary disease)   . Diabetes mellitus without complication     Medications:  Prescriptions prior to admission  Medication Sig Dispense Refill  . aspirin EC 81 MG tablet Take 81 mg by mouth daily.      Marland Kitchen FLUoxetine (PROZAC) 20 MG capsule Take 20 mg by mouth every morning.      Marland Kitchen glimepiride (AMARYL) 4 MG tablet Take 4 mg by mouth daily.      . metFORMIN (GLUCOPHAGE) 1000 MG tablet Take 1,000 mg by mouth 2 (two) times daily.      . metoprolol tartrate (LOPRESSOR) 25 MG tablet Take 25 mg by mouth 2 (two) times daily.      . ramipril (ALTACE) 10 MG capsule Take 10 mg by mouth daily.      . simvastatin (ZOCOR) 40 MG tablet Take 40 mg by mouth at bedtime.      . tamsulosin (FLOMAX) 0.4 MG CAPS capsule Take 0.4 mg by mouth every morning.         Assessment: 80 yo male with chest pain for heparin.    Goal of Therapy:  Heparin level 0.3-0.7 units/ml Monitor platelets by anticoagulation protocol: Yes   Plan:  Heparin 4000 units IV bolus, then 1000 units/hr Check heparin level in 6 hours.   Eddie Candle 12/24/2013,4:58 AM

## 2013-12-24 NOTE — Progress Notes (Signed)
[facility] progress note. Chief complaint. Transfer note. History of present illness. This 80 year old male was seen at [facility] with complaints of shortness of breath, nonproductive cough, dyspnea on exertion, chills and upper respiratory symptoms. Also noted initial troponin elevated to without chest pain. Patient felt to have a COPD exacerbation and initiated on steroids, antibiotics, bronchodilators, oxygen. Troponins are being cycled and the patient initiated on IV heparin, aspirin, beta blocker, statins. I couldn't see the patient at bedside to ensure he remained stable post transfer. Vital signs. Temperature 90.8, pulse 77, respiration 20, blood pressure 137/61. O2 sats 99%. General appearance. Well-developed elderly male who is alert, cooperative, and in no distress. Cardiac. Rate and rhythm regular. Lungs. Some scattered rhonchi bilaterally. Some mild upper airway wheezing on exhalation. No dyspnea at rest and patient able to speak in full sentences without dyspnea. Abdomen. Soft and obese with positive bowel sounds. No pain. Impression/plan. Problem #1. COPD exacerbation. Continue steroids, antibiotics, bronchodilators and oxygen. Patient appears clinically stable. Problem #2. Positive troponin. No active chest pain to rule out non-STEMI patient on telemetry with IV heparin, aspirin, beta blocker, statins. Patient appears clinically stable post transfer and all orders appear to transferred appropriately.

## 2013-12-24 NOTE — Consult Note (Signed)
CARDIOLOGY CONSULT NOTE  Patient ID: Seth Hunt MRN: [id number] DOB/AGE: [birth year] 80 y.o.  Admit date: 12/23/2013 Primary Physician COE,[name], MD Primary Cardiologist None Chief Complaint  Reason for consultation:  Elevated troponin  HPI:  The patient presented to [facility] with SOB.  He had a presenting O2 sat of 87%.  He was treated for a probable COPD flair with albuterol, Atrovent and solumedrol.  He was noted to have an elevated troponin.  He had no acute EKG changes and was not complaining of chest pain.    The patient was apparently admitted for a similar episode in May of last year at [facility].  I was able to pull up these records on Epic.  At that time he had an abnormal echo stress test with possibly a fall in the EF with stress.  However, follow up cath demonstrated only a 50% LAD lesion and mild RCA disease.  His EF was 40%.  He did have an abnormal takeoff of the circ off of the right coronary cusp.  However, he says that he has not been getting any chest pain.  The patient denies any new symptoms such as chest discomfort, neck or arm discomfort. There has been no new PND or orthopnea. There have been no reported palpitations, presyncope or syncope.  He became SOB Saturday after having a slight "cold" before this. He had some fever.  At [facility] hospital he did feel better after bronchodilators.    Past Medical History  Diagnosis Date  . COPD (chronic obstructive pulmonary disease)   . Diabetes mellitus without complication   . BPH (benign prostatic hyperplasia)     Past Surgical History  Procedure Laterality Date  . Back surgery    . Cholecystectomy    . Hernia repair      bilateral inguinal hernia    No Known Allergies  Prior to Admission medications   Medication Sig Start Date End Date Taking? Authorizing Provider  aspirin EC 81 MG tablet Take 81 mg by mouth daily.   Yes Historical Provider, MD  FLUoxetine (PROZAC) 20 MG capsule Take 20 mg by mouth every morning.   Yes  Historical Provider, MD  glimepiride (AMARYL) 4 MG tablet Take 4 mg by mouth daily.   Yes Historical Provider, MD  metFORMIN (GLUCOPHAGE) 1000 MG tablet Take 1,000 mg by mouth 2 (two) times daily.   Yes Historical Provider, MD  metoprolol tartrate (LOPRESSOR) 25 MG tablet Take 25 mg by mouth 2 (two) times daily.   Yes Historical Provider, MD  ramipril (ALTACE) 10 MG capsule Take 10 mg by mouth daily.   Yes Historical Provider, MD  simvastatin (ZOCOR) 40 MG tablet Take 40 mg by mouth at bedtime.   Yes Historical Provider, MD  tamsulosin (FLOMAX) 0.4 MG CAPS capsule Take 0.4 mg by mouth every morning.    Yes Historical Provider, MD    Prescriptions prior to admission  Medication Sig Dispense Refill  . aspirin EC 81 MG tablet Take 81 mg by mouth daily.      Marland Kitchen FLUoxetine (PROZAC) 20 MG capsule Take 20 mg by mouth every morning.      Marland Kitchen glimepiride (AMARYL) 4 MG tablet Take 4 mg by mouth daily.      . metFORMIN (GLUCOPHAGE) 1000 MG tablet Take 1,000 mg by mouth 2 (two) times daily.      . metoprolol tartrate (LOPRESSOR) 25 MG tablet Take 25 mg by mouth 2 (two) times daily.      . ramipril (ALTACE)  10 MG capsule Take 10 mg by mouth daily.      . simvastatin (ZOCOR) 40 MG tablet Take 40 mg by mouth at bedtime.      . tamsulosin (FLOMAX) 0.4 MG CAPS capsule Take 0.4 mg by mouth every morning.        Family History  Problem Relation Age of Onset  . CAD Father 2872  . CAD Mother 9345    Lived until age 78    History   Social History  . Marital Status: Widowed    Spouse Name: N/A    Number of Children: 0  . Years of Education: N/A   Occupational History  . 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 Smoking status: Former Smoker -- 1.00 packs/day for 20 years    Types: Cigarettes  . Smokeless tobacco: Not on file     Comment: Quit 20 years ago  . Alcohol Use: No  . Drug Use: No  . Sexual Activity: Not on file   Other Topics Concern  . Not on file   Social History Narrative     Lives with one stepchild.  He has three other stepchildren.      ROS:  Otherwise as stated in the HPI and negative for all other systems.  Physical Exam: Blood pressure 137/61, pulse 77, temperature 98 F (36.7 C), temperature source Oral, resp. rate 20, height 5\' 8"  (1.727 m), weight 190 lb 11.2 oz (86.5 kg), SpO2 99.00%.  GENERAL:  Well appearing HEENT:  Pupils equal round and reactive, fundi not visualized, oral mucosa unremarkable NECK:  No jugular venous distention, waveform within normal limits, carotid upstroke brisk and symmetric, no bruits, no thyromegaly LYMPHATICS:  No cervical, inguinal adenopathy LUNGS:  Decreased breath sounds BACK:  No CVA tenderness CHEST:  Unremarkable HEART:  PMI not displaced or sustained,S1 and S2 within normal limits, no S3, no S4, no clicks, no rubs, distant heart sounds without obvious murmurs ABD:  Flat, positive bowel sounds normal in frequency in pitch, no bruits, no rebound, no guarding, no midline pulsatile mass, no hepatomegaly, no splenomegaly EXT:  2 plus pulses throughout, no edema, no cyanosis no clubbing SKIN:  No rashes no nodules NEURO:  Cranial nerves II through XII grossly intact, motor grossly intact throughout PSYCH:  Cognitively intact, oriented to person place and time   Labs: Lab Results  Component Value Date   BUN 12 12/24/2013   Lab Results  Component Value Date   CREATININE 0.68 12/24/2013   Lab Results  Component Value Date   NA 140 12/24/2013   K 4.0 12/24/2013   CL 104 12/24/2013   CO2 20 12/24/2013   Lab Results  Component Value Date   TROPONINI 0.59* 12/23/2013   Lab Results  Component Value Date   WBC 7.4 12/24/2013   HGB 12.3* 12/24/2013   HCT 35.5* 12/24/2013   MCV 83.3 12/24/2013   PLT PENDING 12/24/2013   No results found for this basename: CHOL,  HDL,  LDLCALC,  LDLDIRECT,  TRIG,  CHOLHDL   Lab Results  Component Value Date   ALT 14 12/23/2013   AST 19 12/23/2013   ALKPHOS 81 12/23/2013   BILITOT  0.5 12/23/2013     Radiology:   CXR: No acute abnormality noted.  EKG:   Sinus tachycardia, rate 101, poor anterior R wave progression, no acute ST T wave changes.  QTc prolonged.   ASSESSMENT AND PLAN:   ELEVATED TROPONIN:  Given nonobstructive disease on cath a few months ago and the lack of chest pain or other anginal symptoms it is very unlikely that invasive evaluation will be indicated this admission.  He can continue to have his enzymes cycled and repeat EKGs.   DYSPNEA:  This is most likely related to acute on chronic lung disease.  However, he did have a mildy reduce EF on cath last year.  I would repeat an echocardiogram.    Signed: Rollene Rotunda 12/24/2013, 6:59 AM

## 2013-12-24 NOTE — ED Notes (Signed)
Patient ambulated to restroom with one staff assist, tolerated well

## 2013-12-24 NOTE — Progress Notes (Signed)
Echo Lab  2D Echocardiogram completed.  Cassandria Drew L Rafel Garde, RDCS 12/24/2013 1:10 PM

## 2013-12-24 NOTE — Progress Notes (Signed)
[facility] PROGRESS NOTE  Seth Hunt BJY:[id number] DOB: [birth year] DOA: 12/23/2013 PCP: [name], MD  Assessment/Plan:  Principal Problem:   Influenza A: Droplet precautions. Supportive care. Tamiflu. Discontinue Levaquin. Has a lot of cough with clear sputum. Will give Mucinex DM. Physical therapy evaluation. Active Problems:   COPD (chronic obstructive pulmonary disease) no wheeze currently. Discontinue steroids. Continue Spiriva. Continue albuterol as needed.   DM (diabetes mellitus): Uncontrolled do to steroids. See above. Resume Amaryl. Sliding scale. Hold metformin. Had lactic acidosis on admission. Repeat lactate level in the morning.   HTN (hypertension): On antihypertensives   SOB (shortness of breath) with hypoxia due to above.   Troponin level elevated: No chest pain or evidence of acute coronary syndrome. More likely related to acute illness hypoxia, etc. We'll discontinue heparin drip and give DVT prophylaxis low molecular weight heparin. Echocardiogram pending.   Depression History of systolic dysfunction: No evidence of CHF currently.  Code Status:  full Family Communication:   Disposition Plan:  home HPI/Subjective: Shortness of breath better. Much cough with sputum production. No chest pain.  Objective: Filed Vitals:   12/24/13 0953  BP:   Pulse: 75  Temp:   Resp: 18    Intake/Output Summary (Last 24 hours) at 12/24/13 1345 Last data filed at 12/24/13 0950  Gross per 24 hour  Intake  23.67 ml  Output    250 ml  Net -226.33 ml   Filed Weights   12/23/13 1841 12/24/13 0451  Weight: 81.647 kg (180 lb) 86.5 kg (190 lb 11.2 oz)    Exam:   General:  Coughing. Basin full of tissues with clear sputum. Oriented and appropriate.  Cardiovascular: Regular rate rhythm without murmurs gallops rubs  Respiratory: Diminished throughout without wheezes rhonchi or rales  Abdomen: Obese, soft, nontender.  Ext: No clubbing cyanosis or edema.  Basic  Metabolic Panel:  Recent Labs Lab 12/23/13 1914 12/24/13 0530  NA 142 140  K 4.4 4.0  CL 99 104  CO2 26 20  GLUCOSE 302* 297*  BUN 14 12  CREATININE 0.78 0.68  CALCIUM 9.4 8.4   Liver Function Tests:  Recent Labs Lab 12/23/13 1914  AST 19  ALT 14  ALKPHOS 81  BILITOT 0.5  PROT 7.7  ALBUMIN 3.7   No results found for this basename: LIPASE, AMYLASE,  in the last 168 hours No results found for this basename: AMMONIA,  in the last 168 hours CBC:  Recent Labs Lab 12/23/13 1914 12/24/13 0530  WBC 7.4 7.4  NEUTROABS 6.1  --   HGB 15.3 12.3*  HCT 46.0 35.5*  MCV 85.2 83.3  PLT 140* 117*   Cardiac Enzymes:  Recent Labs Lab 12/23/13 2306  TROPONINI 0.59*   BNP (last 3 results) No results found for this basename: PROBNP,  in the last 8760 hours CBG:  Recent Labs Lab 12/23/13 2140 12/24/13 0319 12/24/13 0826 12/24/13 1152  GLUCAP 263* 287* 231* 210*    No results found for this or any previous visit (from the past 240 hour(s)).   Studies: Dg Chest Port 1 View  12/23/2013   CLINICAL DATA:  Shortness of breath  EXAM: PORTABLE CHEST - 1 VIEW  COMPARISON:  None.  FINDINGS: The cardiac shadow is within normal limits. The lungs are well aerated bilaterally. No focal infiltrate or sizable effusion is seen.  IMPRESSION: No acute abnormality noted.   Electronically Signed   By: Alcide Clever M.D.   On: 12/23/2013 19:09    Scheduled Meds: .  aspirin EC  81 mg Oral Daily  . FLUoxetine  20 mg Oral q morning - 10a  . insulin aspart  0-9 Units Subcutaneous TID WC  . levofloxacin (LEVAQUIN) IV  750 mg Intravenous QHS  . methylPREDNISolone (SOLU-MEDROL) injection  60 mg Intravenous Q6H  . metoprolol tartrate  25 mg Oral BID  . oseltamivir  75 mg Oral BID  . ramipril  10 mg Oral Daily  . simvastatin  40 mg Oral QHS  . sodium chloride  3 mL Intravenous Q12H  . tamsulosin  0.4 mg Oral q morning - 10a  . tiotropium  18 mcg Inhalation Daily   Continuous Infusions: .  heparin 1,000 Units/hr (12/24/13 0538)    Time spent: 35 minutes  Lasean Gorniak L  [facility] Pager [phone]. If 7PM-7AM, please contact night-coverage at [email or web address], password [email or web address] 12/24/2013, 1:45 PM  LOS: 1 day

## 2013-12-25 ENCOUNTER — Encounter (HOSPITAL_COMMUNITY): Payer: Self-pay | Admitting: Physician Assistant

## 2013-12-25 DIAGNOSIS — J9601 Acute respiratory failure with hypoxia: Secondary | ICD-10-CM | POA: Diagnosis present

## 2013-12-25 DIAGNOSIS — J9600 Acute respiratory failure, unspecified whether with hypoxia or hypercapnia: Secondary | ICD-10-CM

## 2013-12-25 DIAGNOSIS — I493 Ventricular premature depolarization: Secondary | ICD-10-CM | POA: Diagnosis present

## 2013-12-25 DIAGNOSIS — I428 Other cardiomyopathies: Secondary | ICD-10-CM

## 2013-12-25 DIAGNOSIS — I4949 Other premature depolarization: Secondary | ICD-10-CM

## 2013-12-25 DIAGNOSIS — J441 Chronic obstructive pulmonary disease with (acute) exacerbation: Secondary | ICD-10-CM

## 2013-12-25 LAB — GLUCOSE, CAPILLARY
Glucose-Capillary: 220 mg/dL — ABNORMAL HIGH (ref 70–99)
Glucose-Capillary: 248 mg/dL — ABNORMAL HIGH (ref 70–99)
Glucose-Capillary: 97 mg/dL (ref 70–99)
Glucose-Capillary: 156 mg/dL — ABNORMAL HIGH (ref 70–99)

## 2013-12-25 LAB — CBC
HCT: 34.7 % — ABNORMAL LOW (ref 39.0–52.0)
Hemoglobin: 11.9 g/dL — ABNORMAL LOW (ref 13.0–17.0)
MCH: 28.5 pg (ref 26.0–34.0)
MCHC: 34.3 g/dL (ref 30.0–36.0)
MCV: 83 fL (ref 78.0–100.0)
PLATELETS: 109 10*3/uL — AB (ref 150–400)
RBC: 4.18 MIL/uL — AB (ref 4.22–5.81)
RDW: 12.8 % (ref 11.5–15.5)
WBC: 7.8 10*3/uL (ref 4.0–10.5)

## 2013-12-25 LAB — MAGNESIUM: MAGNESIUM: 1.9 mg/dL (ref 1.5–2.5)

## 2013-12-25 LAB — LACTIC ACID, PLASMA: LACTIC ACID, VENOUS: 0.9 mmol/L (ref 0.5–2.2)

## 2013-12-25 MED ORDER — PREDNISONE 20 MG PO TABS
40.0000 mg | ORAL_TABLET | Freq: Two times a day (BID) | ORAL | Status: DC
  Administered 2013-12-25 – 2013-12-26 (×3): 40 mg via ORAL
  Filled 2013-12-25 (×4): qty 2

## 2013-12-25 MED ORDER — IPRATROPIUM BROMIDE 0.02 % IN SOLN
0.5000 mg | Freq: Four times a day (QID) | RESPIRATORY_TRACT | Status: DC
  Administered 2013-12-25 – 2013-12-26 (×3): 0.5 mg via RESPIRATORY_TRACT
  Filled 2013-12-25 (×3): qty 2.5

## 2013-12-25 MED ORDER — INSULIN ASPART 100 UNIT/ML ~~LOC~~ SOLN
0.0000 [IU] | Freq: Every day | SUBCUTANEOUS | Status: DC
  Administered 2013-12-27 – 2013-12-29 (×2): 2 [IU] via SUBCUTANEOUS

## 2013-12-25 MED ORDER — INSULIN ASPART 100 UNIT/ML ~~LOC~~ SOLN
0.0000 [IU] | Freq: Three times a day (TID) | SUBCUTANEOUS | Status: DC
  Administered 2013-12-26: 7 [IU] via SUBCUTANEOUS
  Administered 2013-12-26: 4 [IU] via SUBCUTANEOUS
  Administered 2013-12-26: 3 [IU] via SUBCUTANEOUS
  Administered 2013-12-27: 4 [IU] via SUBCUTANEOUS
  Administered 2013-12-27: 7 [IU] via SUBCUTANEOUS
  Administered 2013-12-27: 4 [IU] via SUBCUTANEOUS
  Administered 2013-12-28: 7 [IU] via SUBCUTANEOUS
  Administered 2013-12-29 – 2013-12-30 (×4): 4 [IU] via SUBCUTANEOUS
  Administered 2013-12-30: 11 [IU] via SUBCUTANEOUS
  Administered 2013-12-30: 7 [IU] via SUBCUTANEOUS
  Administered 2013-12-31: 3 [IU] via SUBCUTANEOUS
  Administered 2013-12-31 – 2014-01-01 (×2): 4 [IU] via SUBCUTANEOUS
  Administered 2014-01-01: 7 [IU] via SUBCUTANEOUS
  Administered 2014-01-01: 4 [IU] via SUBCUTANEOUS

## 2013-12-25 MED ORDER — LEVALBUTEROL HCL 0.63 MG/3ML IN NEBU
0.6300 mg | INHALATION_SOLUTION | Freq: Four times a day (QID) | RESPIRATORY_TRACT | Status: DC
  Administered 2013-12-25 – 2013-12-26 (×3): 0.63 mg via RESPIRATORY_TRACT
  Filled 2013-12-25 (×7): qty 3

## 2013-12-25 MED ORDER — INSULIN DETEMIR 100 UNIT/ML ~~LOC~~ SOLN
10.0000 [IU] | Freq: Every day | SUBCUTANEOUS | Status: DC
  Administered 2013-12-25 – 2013-12-30 (×6): 10 [IU] via SUBCUTANEOUS
  Filled 2013-12-25 (×6): qty 0.1

## 2013-12-25 NOTE — Progress Notes (Addendum)
Patient: Seth BussingWesley Horger / Admit Date: 12/23/2013 / Date of Encounter: 12/25/2013, 8:48 AM   Subjective  Still SOB when ambulating in the room, but he feels this is improving. No CP, orthopnea, PND, dizziness, history of presyncope or syncope. Still productively coughing. Occ wheezing.  Objective   Telemetry: NSR, frequent multifocal PVCs/couplets/ up to 5 beats NSVT at a time  Physical Exam: Blood pressure 136/72, pulse 70, temperature 98.4 F (36.9 C), temperature source Oral, resp. rate 16, height 5\' 8"  (1.727 m), weight 190 lb 11.2 oz (86.5 kg), SpO2 98.00%. General: Well developed, well nourished WM in no acute distress. Laying flat in bed. Head: Normocephalic, atraumatic, sclera non-icteric, no xanthomas, nares are without discharge. Neck: Negative for carotid bruits. JVP not elevated. Lungs: Markedly diminished througout without wheezes, rales, or rhonchi. Breathing is unlabored. Heart: Distant heart sounds, RRR S1 S2 without murmurs, rubs, or gallops.  Abdomen: Soft, non-tender, non-distended with normoactive bowel sounds. No rebound/guarding. Extremities: No clubbing or cyanosis. No edema. Distal pedal pulses are 2+ and equal bilaterally. Neuro: Alert and oriented X 3. Moves all extremities spontaneously. Psych:  Responds to questions appropriately with a normal affect.   Intake/Output Summary (Last 24 hours) at 12/25/13 0848 Last data filed at 12/24/13 1300  Gross per 24 hour  Intake    540 ml  Output    800 ml  Net   -260 ml    Inpatient Medications:  . aspirin EC  81 mg Oral Daily  . dextromethorphan-guaiFENesin  1 tablet Oral BID  . enoxaparin (LOVENOX) injection  40 mg Subcutaneous Daily  . FLUoxetine  20 mg Oral q morning - 10a  . glimepiride  4 mg Oral Q breakfast  . insulin aspart  0-9 Units Subcutaneous TID WC  . metoprolol tartrate  25 mg Oral BID  . oseltamivir  75 mg Oral BID  . ramipril  10 mg Oral Daily  . simvastatin  40 mg Oral QHS  . sodium chloride   3 mL Intravenous Q12H  . tamsulosin  0.4 mg Oral q morning - 10a  . tiotropium  18 mcg Inhalation Daily   Infusions:    Labs:  Recent Labs  12/23/13 1914 12/24/13 0530 12/25/13 0330  NA 142 140  --   K 4.4 4.0  --   CL 99 104  --   CO2 26 20  --   GLUCOSE 302* 297*  --   BUN 14 12  --   CREATININE 0.78 0.68  --   CALCIUM 9.4 8.4  --   MG  --   --  1.9    Recent Labs  12/23/13 1914  AST 19  ALT 14  ALKPHOS 81  BILITOT 0.5  PROT 7.7  ALBUMIN 3.7    Recent Labs  12/23/13 1914 12/24/13 0530 12/25/13 0615  WBC 7.4 7.4 7.8  NEUTROABS 6.1  --   --   HGB 15.3 12.3* 11.9*  HCT 46.0 35.5* 34.7*  MCV 85.2 83.3 83.0  PLT 140* 117* 109*    Recent Labs  12/23/13 2306  TROPONINI 0.59*    Recent Labs  12/24/13 0530  HGBA1C 6.8*     Radiology/Studies:  Dg Chest Port 1 View  12/23/2013   CLINICAL DATA:  Shortness of breath  EXAM: PORTABLE CHEST - 1 VIEW  COMPARISON:  None.  FINDINGS: The cardiac shadow is within normal limits. The lungs are well aerated bilaterally. No focal infiltrate or sizable effusion is seen.  IMPRESSION: No acute abnormality  noted.   Electronically Signed   By: Alcide Clever M.D.   On: 12/23/2013 19:09     Assessment and Plan  1. COPD exacerbation with acute respiratory failure (presenting O2 sat 87%) - improving but still SOB with ambulation. Will need O2 assessment prior to DC. 2. Influenza A - on Tamiflu. 3. 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 Not clear why f/u enzymes not drawn, will ask nursing to contact lab. MD to comment regarding repeat ischemic eval this admission.  4. Nonischemic cardiomyopathy - etiology unclear. EF 30-35% by echo this adm (40% by cath 04/2013). Has been on ACEI/BB at home. He appears euvolemic. I favor continuing metoprolol over coreg given its selectivity with his emphysema. 5. Frequent  PVCs/NSVT - lytes OK. May be related to cardiomyopathy. PVCs noted in procedure note from stress echo 04/2013. Could consider EP eval given decreased EF, NSVT, frequent multifocal PVCs. 6. Anemia/thrombocytopenia -per IM.  Signed, Ronie Spies PA-C   Addendum: lab informs [place] that the reason troponins were not cycled is because order was originally written at [facility]. Will defer to MD regarding whether these should be rechecked. Ronie Spies PA-C  The patient was seen, examined and discussed with Ronie Spies, PA-C and I agree with the above.  We will continue monitoring Troponin and follow. His very frequent PVC that are multifocal and shorts runs (longest 3 beats) are concerning for ischemia. There are negative T waves in  The anterior precordial leads, no old ECG available for comparison. Once the patient recovered from acute infection a stress test should be performed. The patient should also be referred to EP for an ICD implantation (once the infection is cleared).   Tobias Alexander, Rexene Edison 12/25/2013

## 2013-12-25 NOTE — Progress Notes (Signed)
Pt had 5 bts NSVT on tele.  Pt asymptomatic with stable VS - BP 138/72 HR 62 96% on 2L O2.  [facility] NP notified with new orders for magnesium level.  Will continue to monitor.

## 2013-12-25 NOTE — Progress Notes (Signed)
Inpatient Diabetes Program Recommendations  AACE/ADA: New Consensus Statement on Inpatient Glycemic Control (2013)  Target Ranges:  Prepandial:   less than 140 mg/dL      Peak postprandial:   less than 180 mg/dL (1-2 hours)      Critically ill patients:  140 - 180 mg/dL     Results for EFRAN, BLAKER (MRN [id number]) as of 12/25/2013 11:44  Ref. Range 12/24/2013 08:26 12/24/2013 11:52 12/24/2013 16:52 12/24/2013 21:51  Glucose-Capillary Latest Range: 70-99 mg/dL 245 (H) 809 (H) 983 (H) 202 (H)    Results for ALYAN, LUEBKE (MRN [id number]) as of 12/25/2013 11:44  Ref. Range 12/25/2013 07:47 12/25/2013 11:03  Glucose-Capillary Latest Range: 70-99 mg/dL 673 (H) 419 (H)    **Noted IV steroids stopped yesterday.  Last dose Solumedrol given 01/11 at ~1pm.  **Patient still having elevated glucose levels.   **MD- Please consider increasing Novolog correction scale (SSI) to Moderate scale tid ac + HS (currently on Sensitive scale)   Will follow. Ambrose Finland RN, MSN, CDE Diabetes Coordinator Inpatient Diabetes Program Team Pager: [phone] (8a-10p)

## 2013-12-25 NOTE — Care Management Note (Unsigned)
    Page 1 of 1   12/29/2013     4:20:30 PM   CARE MANAGEMENT NOTE 12/29/2013  Patient:  MANBIR, LOSCHIAVO   Account Number:  [id number]  Date Initiated:  12/25/2013  Documentation initiated by:  GRAVES-BIGELOW,Mario Voong  Subjective/Objective Assessment:   Pt admitted for COPD exacerbation with acute respiratory failure (presenting O2 sat 87%).     Action/Plan:   CM to assess for 02 need for home.   Anticipated DC Date:  12/27/2013   Anticipated DC Plan:  HOME W HOME HEALTH SERVICES      DC Planning Services  CM consult      [facility] Choice  DURABLE MEDICAL EQUIPMENT   Choice offered to / List presented to:  C-1 Patient   DME arranged  OTHER - SEE COMMENT      DME agency  OTHER - SEE NOTE        Status of service:  In process, will continue to follow Medicare Important Message given?   (If response is "NO", the following Medicare IM given date fields will be blank) Date Medicare IM given:   Date Additional Medicare IM given:    Discharge Disposition:    Per UR Regulation:  Reviewed for med. necessity/level of care/duration of stay  If discussed at Long Length of Stay Meetings, dates discussed:   12/28/2013    Comments:  12-29-13 1619 Tomi Bamberger, RN,BSN [phone] life vest via zoll. Pt to be fit 12-29-13.

## 2013-12-25 NOTE — Progress Notes (Signed)
[facility] PROGRESS NOTE  Seth Hunt MWU:[id number] DOB: 1932-12-22 DOA: 12/23/2013 PCP: Seth Hunt  Assessment/Plan:  Principal Problem:   Influenza A: Droplet precautions. Supportive care. Tamiflu. Discontinue Levaquin. Has a lot of cough with clear sputum. Will give Mucinex DM. Physical therapy evaluation. Active Problems:   COPD (chronic obstructive pulmonary disease): Lung exam worse today. We'll give prednisone 40 mg by mouth twice a day. Schedule Xopenex and Atrovent. Discontinue Spiriva.   DM (diabetes mellitus): Uncontrolled do to steroids. See above. Resume Amaryl. Sliding scale. Hold metformin. Had lactic acidosis on admission. Repeat lactate level in the morning. Increase sliding scale. Add Levemir. Check hemoglobin A1c.   HTN (hypertension): On antihypertensives   Troponin level elevated: Ischemia workup per cardiology once pulmonary issues stabilized.   Depression Chronic systolic heart failure per cardiology  Code Status:  full Family Communication:   Disposition Plan:  home HPI/Subjective: Shortness of breath better but continues to be dyspneic on exertion. Productive cough and wheeze remain. No chest pain.  Objective: Filed Vitals:   12/25/13 0644  BP: 136/72  Pulse: 70  Temp: 98.4 F (36.9 C)  Resp: 16    Intake/Output Summary (Last 24 hours) at 12/25/13 1226 Last data filed at 12/24/13 1300  Gross per 24 hour  Intake    240 ml  Output    550 ml  Net   -310 ml   Filed Weights   12/23/13 1841 12/24/13 0451  Weight: 81.647 kg (180 lb) 86.5 kg (190 lb 11.2 oz)    Exam:   General:  Coughing. Basin full of tissues with clear sputum. Oriented and appropriate.  Cardiovascular: Regular rate rhythm without murmurs gallops rubs  Respiratory: Diminished throughout with expiratory wheeze and prolonged expiratory phase.  Abdomen: Obese, soft, nontender.  Ext: No clubbing cyanosis or edema.  Basic Metabolic Panel:  Recent Labs Lab  12/23/13 1914 12/24/13 0530 12/25/13 0330  NA 142 140  --   K 4.4 4.0  --   CL 99 104  --   CO2 26 20  --   GLUCOSE 302* 297*  --   BUN 14 12  --   CREATININE 0.78 0.68  --   CALCIUM 9.4 8.4  --   MG  --   --  1.9   Liver Function Tests:  Recent Labs Lab 12/23/13 1914  AST 19  ALT 14  ALKPHOS 81  BILITOT 0.5  PROT 7.7  ALBUMIN 3.7   No results found for this basename: LIPASE, AMYLASE,  in the last 168 hours No results found for this basename: AMMONIA,  in the last 168 hours CBC:  Recent Labs Lab 12/23/13 1914 12/24/13 0530 12/25/13 0615  WBC 7.4 7.4 7.8  NEUTROABS 6.1  --   --   HGB 15.3 12.3* 11.9*  HCT 46.0 35.5* 34.7*  MCV 85.2 83.3 83.0  PLT 140* 117* 109*   Cardiac Enzymes:  Recent Labs Lab 12/23/13 2306  TROPONINI 0.59*   BNP (last 3 results) No results found for this basename: PROBNP,  in the last 8760 hours CBG:  Recent Labs Lab 12/24/13 1152 12/24/13 1652 12/24/13 2151 12/25/13 0747 12/25/13 1103  GLUCAP 210* 229* 202* 220* 248*    No results found for this or any previous visit (from the past 240 hour(s)).   Studies: Dg Chest Port 1 View  12/23/2013   CLINICAL DATA:  Shortness of breath  EXAM: PORTABLE CHEST - 1 VIEW  COMPARISON:  None.  FINDINGS: The cardiac shadow is within  normal limits. The lungs are well aerated bilaterally. No focal infiltrate or sizable effusion is seen.  IMPRESSION: No acute abnormality noted.   Electronically Signed   By: Alcide Clever M.D.   On: 12/23/2013 19:09    Scheduled Meds: . aspirin EC  81 mg Oral Daily  . dextromethorphan-guaiFENesin  1 tablet Oral BID  . enoxaparin (LOVENOX) injection  40 mg Subcutaneous Daily  . FLUoxetine  20 mg Oral q morning - 10a  . glimepiride  4 mg Oral Q breakfast  . insulin aspart  0-9 Units Subcutaneous TID WC  . metoprolol tartrate  25 mg Oral BID  . oseltamivir  75 mg Oral BID  . ramipril  10 mg Oral Daily  . simvastatin  40 mg Oral QHS  . sodium chloride  3 mL  Intravenous Q12H  . tamsulosin  0.4 mg Oral q morning - 10a  . tiotropium  18 mcg Inhalation Daily   Continuous Infusions:    Time spent: 35 minutes  Seth Hunt  [facility] Pager [phone]. If 7PM-7AM, please contact night-coverage at [email or web address], password [facility] 12/25/2013, 12:26 PM  LOS: 2 days

## 2013-12-25 NOTE — Evaluation (Signed)
Physical Therapy Evaluation Patient Details Name: Seth Hunt MRN: [id number] DOB: February 18, 1933 Today's Date: 12/25/2013 Time: 1208-1220 PT Time Calculation (min): 12 min  PT Assessment / Plan / Recommendation History of Present Illness  80 y.o. male admitted to [facility] on 12/23/13 with DM, HTN, HPL, COPD presented with SOB, non productive cough, associated with DOE, chills after recent URI symptoms; patient found to have mild hypoxia in ED which improved after inhalers+oxygen.  (+) for Influenza A, put on droplet precautions.    Clinical Impression  Pt is moving well, mostly on his own power, although it is evident that the illness has generally made him feel weak and unsteady on his feet.  Productive cough and DOE throughout session.  O2 stats stable on 2 L O2 [place].  Not tested without O2 today.   PT to follow acutely for deficits listed below.       PT Assessment  Patient needs continued PT services    Follow Up Recommendations  No PT follow up;Supervision - Intermittent    Does the patient have the potential to tolerate intense rehabilitation     NA  Barriers to Discharge   None      Equipment Recommendations  None recommended by PT    Recommendations for Other Services   None  Frequency Min 3X/week    Precautions / Restrictions Precautions Precautions: Other (comment) Precaution Comments: monitor O2 sats.  Pt did not use O2 PTA   Pertinent Vitals/Pain HR 112-118 during mobility, O2 sats 98% on 2 L O2 [place] during mobility.  DOE 3/4 with mobility and on O2.      Mobility  Bed Mobility Overal bed mobility: Independent Transfers Overall transfer level: Independent Equipment used: None Ambulation/Gait Ambulation/Gait assistance: Supervision Ambulation Distance (Feet): 15 Feet Assistive device: None Gait Pattern/deviations: Staggering left;Staggering right Gait velocity: decreased General Gait Details: Pt is staggering gait around the room, using furniture for support.  O2  line/extension used throughout session (2 L O2 [place]).  Pt with 3/4 DOE even with short distance gait, productive cough with sputum x 2.  O2 sats maintained in the upper 90s with O2 [place] on during mobility, HR 112-118 during short distance mobilit.         PT Diagnosis: Abnormality of gait;Generalized weakness;Difficulty walking  PT Problem List: Decreased activity tolerance;Decreased balance;Decreased mobility;Cardiopulmonary status limiting activity PT Treatment Interventions: DME instruction;Gait training;Stair training;Functional mobility training;Therapeutic activities;Therapeutic exercise;Balance training;Neuromuscular re-education;Patient/family education     PT Goals(Current goals can be found in the care plan section) Acute Rehab PT Goals Patient Stated Goal: to feel better and go home once he feels better PT Goal Formulation: With patient Time For Goal Achievement: 01/08/14 Potential to Achieve Goals: Good  Visit Information  Last PT Received On: 12/25/13 Assistance Needed: +1 History of Present Illness: 80 y.o. male admitted to [facility] on 12/23/13 with DM, HTN, HPL, COPD presented with SOB, non productive cough, associated with DOE, chills after recent URI symptoms; patient found to have mild hypoxia in ED which improved after inhalers+oxygen.  (+) for Influenza A, put on droplet precautions.         Prior Functioning  Home Living Family/patient expects to be discharged to:: Private residence Living Arrangements: Children (step daughter and son-in-law) Available Help at Discharge: Family;Available PRN/intermittently Type of Home: House Home Access: Stairs to enter [employer] of Steps: 5 Entrance Stairs-Rails: Right;Left;Can reach both Home Layout: One level Home Equipment: None Additional Comments: Step daughter works first shift, son in law works second  shift.  Prior Function Level of Independence: Independent Comments: Pt does not work.   Communication Communication: HOH    Cognition  Cognition Arousal/Alertness: Awake/alert Behavior During Therapy: WFL for tasks assessed/performed Overall Cognitive Status: Within Functional Limits for tasks assessed    Extremity/Trunk Assessment Upper Extremity Assessment Upper Extremity Assessment: Overall WFL for tasks assessed Lower Extremity Assessment Lower Extremity Assessment: Overall WFL for tasks assessed Cervical / Trunk Assessment Cervical / Trunk Assessment: Normal   Balance Balance Overall balance assessment: Needs assistance Sitting-balance support: No upper extremity supported;Feet supported Sitting balance-Leahy Scale: Normal Standing balance support: Single extremity supported Standing balance-Leahy Scale: Good  End of Session PT - End of Session Equipment Utilized During Treatment: Oxygen Activity Tolerance: Patient limited by fatigue Patient left: in chair;with call bell/phone within reach Nurse Communication: Mobility status    Lurena JoinerRebecca B. Kerissa Coia, PT, DPT [phone]   12/25/2013, 1:41 PM

## 2013-12-25 NOTE — Progress Notes (Signed)
UR Completed Dessa Ledee Graves-Bigelow, RN,BSN [phone]  

## 2013-12-26 ENCOUNTER — Inpatient Hospital Stay (HOSPITAL_COMMUNITY): Payer: Medicare Other

## 2013-12-26 DIAGNOSIS — R799 Abnormal finding of blood chemistry, unspecified: Secondary | ICD-10-CM

## 2013-12-26 LAB — GLUCOSE, CAPILLARY
GLUCOSE-CAPILLARY: 195 mg/dL — AB (ref 70–99)
GLUCOSE-CAPILLARY: 121 mg/dL — AB (ref 70–99)
GLUCOSE-CAPILLARY: 152 mg/dL — AB (ref 70–99)
Glucose-Capillary: 201 mg/dL — ABNORMAL HIGH (ref 70–99)

## 2013-12-26 LAB — PRO B NATRIURETIC PEPTIDE: Pro B Natriuretic peptide (BNP): 15982 pg/mL — ABNORMAL HIGH (ref 0–450)

## 2013-12-26 LAB — TROPONIN I: Troponin I: <0.3 ng/mL (ref <0.30)

## 2013-12-26 MED ORDER — FUROSEMIDE 10 MG/ML IJ SOLN
20.0000 mg | Freq: Once | INTRAMUSCULAR | Status: AC
  Administered 2013-12-26: 20 mg via INTRAVENOUS
  Filled 2013-12-26: qty 2

## 2013-12-26 MED ORDER — LEVALBUTEROL HCL 0.63 MG/3ML IN NEBU
0.6300 mg | INHALATION_SOLUTION | Freq: Three times a day (TID) | RESPIRATORY_TRACT | Status: DC
  Administered 2013-12-26 – 2013-12-28 (×4): 0.63 mg via RESPIRATORY_TRACT
  Filled 2013-12-26 (×11): qty 3

## 2013-12-26 MED ORDER — PREDNISONE 20 MG PO TABS
40.0000 mg | ORAL_TABLET | Freq: Every day | ORAL | Status: DC
  Administered 2013-12-27 – 2013-12-29 (×3): 40 mg via ORAL
  Filled 2013-12-26 (×4): qty 2

## 2013-12-26 MED ORDER — LEVALBUTEROL HCL 0.63 MG/3ML IN NEBU: 0.6300 mg | INHALATION_SOLUTION | RESPIRATORY_TRACT | Status: DC | PRN

## 2013-12-26 MED ORDER — IPRATROPIUM BROMIDE 0.02 % IN SOLN
0.5000 mg | Freq: Three times a day (TID) | RESPIRATORY_TRACT | Status: DC
  Administered 2013-12-26 – 2013-12-28 (×4): 0.5 mg via RESPIRATORY_TRACT
  Filled 2013-12-26 (×5): qty 2.5

## 2013-12-26 NOTE — Progress Notes (Signed)
[facility] PROGRESS NOTE  Seth Hunt UUV:[id number] DOB: July 29, 1933 DOA: 12/23/2013 PCP: [name], MD  Assessment/Plan:  Principal Problem:   Influenza A: continue Tamiflu. Still with much cough, but overall, improving. Active Problems:   COPD (chronic obstructive pulmonary disease): less wheeze today. Wean oxygen. Increase activity. Taper pred. Continue nebs   DM (diabetes mellitus): Uncontrolled do to steroids. Continue Amaryl. Sliding scale. Metformin held.  continue Levemir. hgb A1C 6.8.    HTN (hypertension): controlled   Troponin level elevated: Discussed with Dr. Delton See. Likely outpatient ischemia workup once pulmonary issues stabilized, likely as outpatient. Patient lives in [place], so can arrange with [place] office.   Depression Chronic systolic heart failure: Given a dose of Lasix today. Chest x-ray improved, but pro BNP high. No previous for comparison.  Code Status:  full Family Communication:   Disposition Plan:  home HPI/Subjective: Feels better today. Still with cough productive of copious sputum. Breathing easier but some dyspnea on exertion. Feeling stronger. Appetite improving.  Objective: Filed Vitals:   12/26/13 0607  BP: 131/67  Pulse: 84  Temp: 97.8 F (36.6 C)  Resp: 18    Intake/Output Summary (Last 24 hours) at 12/26/13 1418 Last data filed at 12/26/13 4742  Gross per 24 hour  Intake      3 ml  Output    300 ml  Net   -297 ml   Filed Weights   12/23/13 1841 12/24/13 0451  Weight: 81.647 kg (180 lb) 86.5 kg (190 lb 11.2 oz)    Exam:   General:  Less cough today. Basin full of tissues with clear sputum. Oriented and appropriate.  Cardiovascular: Regular rate rhythm without murmurs gallops rubs  Respiratory: Diminished throughout. No wheezes rhonchi or rales today.  Abdomen: Obese, soft, nontender.  Ext: No clubbing cyanosis or edema.  Basic Metabolic Panel:  Recent Labs Lab 12/23/13 1914 12/24/13 0530  12/25/13 0330  NA 142 140  --   K 4.4 4.0  --   CL 99 104  --   CO2 26 20  --   GLUCOSE 302* 297*  --   BUN 14 12  --   CREATININE 0.78 0.68  --   CALCIUM 9.4 8.4  --   MG  --   --  1.9   Liver Function Tests:  Recent Labs Lab 12/23/13 1914  AST 19  ALT 14  ALKPHOS 81  BILITOT 0.5  PROT 7.7  ALBUMIN 3.7   No results found for this basename: LIPASE, AMYLASE,  in the last 168 hours No results found for this basename: AMMONIA,  in the last 168 hours CBC:  Recent Labs Lab 12/23/13 1914 12/24/13 0530 12/25/13 0615  WBC 7.4 7.4 7.8  NEUTROABS 6.1  --   --   HGB 15.3 12.3* 11.9*  HCT 46.0 35.5* 34.7*  MCV 85.2 83.3 83.0  PLT 140* 117* 109*   Cardiac Enzymes:  Recent Labs Lab 12/23/13 2306 12/26/13 1315  TROPONINI 0.59* <0.30   BNP (last 3 results)  Recent Labs  12/26/13 1105  PROBNP 15982.0*   CBG:  Recent Labs Lab 12/25/13 1103 12/25/13 1630 12/25/13 2053 12/26/13 0808 12/26/13 1155  GLUCAP 248* 97 156* 195* 201*    No results found for this or any previous visit (from the past 240 hour(s)).   Studies: Dg Chest 2 View  12/26/2013   CLINICAL DATA:  Dyspnea and cough and congestion.  EXAM: CHEST  2 VIEW  COMPARISON:  Portable chest x-rays of December 23, 2012.  FINDINGS: The lungs are mildly hyperinflated. There are coarse lung markings in the retrocardiac region on the left and to a lesser extent in the right infrahilar region which suggests atelectasis or early pneumonia. The cardiopericardial silhouette is normal in size. The pulmonary vascularity is not engorged. The mediastinum is normal in width. There is no pleural effusion. The observed portions of the bony thorax exhibit no acute abnormalities. There are surgical skin staples over the upper abdomen.  IMPRESSION: The findings are consistent with atelectasis or early interstitial pneumonia bilaterally in the lower lobes. This is superimposed upon findings of C OPD. There is no evidence of CHF nor  pleural effusion.   Electronically Signed   By: David  [place]   On: 12/26/2013 12:27    Scheduled Meds: . aspirin EC  81 mg Oral Daily  . dextromethorphan-guaiFENesin  1 tablet Oral BID  . enoxaparin (LOVENOX) injection  40 mg Subcutaneous Daily  . FLUoxetine  20 mg Oral q morning - 10a  . furosemide  20 mg Intravenous Once  . glimepiride  4 mg Oral Q breakfast  . insulin aspart  0-20 Units Subcutaneous TID WC  . insulin aspart  0-5 Units Subcutaneous QHS  . insulin detemir  10 Units Subcutaneous Daily  . ipratropium  0.5 mg Nebulization TID  . levalbuterol  0.63 mg Nebulization TID  . metoprolol tartrate  25 mg Oral BID  . oseltamivir  75 mg Oral BID  . predniSONE  40 mg Oral BID  . ramipril  10 mg Oral Daily  . simvastatin  40 mg Oral QHS  . sodium chloride  3 mL Intravenous Q12H  . tamsulosin  0.4 mg Oral q morning - 10a   Continuous Infusions:    Time spent: 25 minutes  Marijke Guadiana L  [facility] Pager [phone]. If 7PM-7AM, please contact night-coverage at [email or web address], password [email or web address] 12/26/2013, 2:18 PM  LOS: 3 days

## 2013-12-26 NOTE — Progress Notes (Signed)
Subjective: Breathing better.  No CP.  Productive cough.  Objective: Vital signs in last 24 hours: Temp:  [97.8 F (36.6 C)-98.3 F (36.8 C)] 97.8 F (36.6 C) (01/13 0607) Pulse Rate:  [65-112] 84 (01/13 0607) Resp:  [16-18] 18 (01/13 0607) BP: (129-149)/(58-67) 131/67 mmHg (01/13 0607) SpO2:  [96 %-98 %] 97 % (01/13 0741) Last BM Date: 12/25/13  Intake/Output from previous day: 01/12 0701 - 01/13 0700 In: 3 [I.V.:3] Out: 300 [Urine:300] Intake/Output this shift:    Medications Current Facility-Administered Medications  Medication Dose Route Frequency Provider Last Rate Last Dose  . acetaminophen (TYLENOL) tablet 650 mg  650 mg Oral Q6H PRN Esperanza SheetsUlugbek N Buriev, MD       Or  . acetaminophen (TYLENOL) suppository 650 mg  650 mg Rectal Q6H PRN Esperanza SheetsUlugbek N Buriev, MD      . aspirin EC tablet 81 mg  81 mg Oral Daily Esperanza SheetsUlugbek N Buriev, MD   81 mg at 12/25/13 1117  . dextromethorphan-guaiFENesin (MUCINEX DM) 30-600 MG per 12 hr tablet 1 tablet  1 tablet Oral BID Christiane Haorinna L Sullivan, MD   1 tablet at 12/25/13 2158  . enoxaparin (LOVENOX) injection 40 mg  40 mg Subcutaneous Daily Christiane Haorinna L Sullivan, MD   40 mg at 12/25/13 1117  . FLUoxetine (PROZAC) capsule 20 mg  20 mg Oral q morning - 10a Esperanza SheetsUlugbek N Buriev, MD   20 mg at 12/25/13 1117  . glimepiride (AMARYL) tablet 4 mg  4 mg Oral Q breakfast Christiane Haorinna L Sullivan, MD   4 mg at 12/26/13 0849  . insulin aspart (novoLOG) injection 0-20 Units  0-20 Units Subcutaneous TID WC Christiane Haorinna L Sullivan, MD   4 Units at 12/26/13 0849  . insulin aspart (novoLOG) injection 0-5 Units  0-5 Units Subcutaneous QHS Christiane Haorinna L Sullivan, MD      . insulin detemir (LEVEMIR) injection 10 Units  10 Units Subcutaneous Daily Christiane Haorinna L Sullivan, MD   10 Units at 12/25/13 1352  . ipratropium (ATROVENT) nebulizer solution 0.5 mg  0.5 mg Nebulization TID Christiane Haorinna L Sullivan, MD      . levalbuterol Pauline Aus(XOPENEX) nebulizer solution 0.63 mg  0.63 mg Nebulization TID Christiane Haorinna L  Sullivan, MD      . levalbuterol Pauline Aus(XOPENEX) nebulizer solution 0.63 mg  0.63 mg Nebulization Q4H PRN Christiane Haorinna L Sullivan, MD      . metoprolol tartrate (LOPRESSOR) tablet 25 mg  25 mg Oral BID Esperanza SheetsUlugbek N Buriev, MD   25 mg at 12/25/13 2158  . ondansetron (ZOFRAN) tablet 4 mg  4 mg Oral Q6H PRN Esperanza SheetsUlugbek N Buriev, MD       Or  . ondansetron (ZOFRAN) injection 4 mg  4 mg Intravenous Q6H PRN Esperanza SheetsUlugbek N Buriev, MD      . oseltamivir (TAMIFLU) capsule 75 mg  75 mg Oral BID Christiane Haorinna L Sullivan, MD   75 mg at 12/25/13 2158  . predniSONE (DELTASONE) tablet 40 mg  40 mg Oral BID Christiane Haorinna L Sullivan, MD   40 mg at 12/25/13 2158  . ramipril (ALTACE) capsule 10 mg  10 mg Oral Daily Esperanza SheetsUlugbek N Buriev, MD   10 mg at 12/25/13 1117  . simvastatin (ZOCOR) tablet 40 mg  40 mg Oral QHS Esperanza SheetsUlugbek N Buriev, MD   40 mg at 12/25/13 2158  . sodium chloride 0.9 % injection 3 mL  3 mL Intravenous Q12H Esperanza SheetsUlugbek N Buriev, MD   3 mL at 12/25/13 2200  . tamsulosin (FLOMAX) capsule 0.4 mg  0.4 mg Oral q morning - 10a Esperanza Sheets, MD   0.4 mg at 12/25/13 1117    PE: General appearance: alert, cooperative and no distress,  Breathing sounds wet  Lungs: Decreased BS > on the right. Heart: irregular rate and rhythm Abdomen: +BS Extremities: No LEE Pulses: 2+ and symmetric Skin: Warm and dry Neurologic: Grossly normal  Lab Results:   Recent Labs  12/23/13 1914 12/24/13 0530 12/25/13 0615  WBC 7.4 7.4 7.8  HGB 15.3 12.3* 11.9*  HCT 46.0 35.5* 34.7*  PLT 140* 117* 109*   BMET  Recent Labs  12/23/13 1914 12/24/13 0530  NA 142 140  K 4.4 4.0  CL 99 104  CO2 26 20  GLUCOSE 302* 297*  BUN 14 12  CREATININE 0.78 0.68  CALCIUM 9.4 8.4    Assessment/Plan    Principal Problem:   Influenza A Active Problems:   COPD (chronic obstructive pulmonary disease)   DM (diabetes mellitus)   HTN (hypertension)   SOB (shortness of breath)   Troponin level elevated   Depression   Thrombocytopenia, unspecified   Acute  respiratory failure with hypoxia   NICM (nonischemic cardiomyopathy)   Frequent PVCs  Plan:  The patient sounds wet.  No I&O's charted.  Will get weight for today.  Repeat chest xray and BNP today.  Will give 20mg  IV lasix  X1.  Peak troponin 0.59.  Thought to be demand ischemia from COPD exacerbation and moderate nonobstructive CAD.  BP and HR stable.  Frequent PVCs.   Stress test when recovered.  EP consult for ICD given EF 30-35%.     LOS: 3 days    HAGER, BRYAN 12/26/2013 23:57 AM  80 year old male   1. COPD exacerbation with acute respiratory failure socondary to influenza (presenting O2 sat 87%) - improving but still SOB with ambulation, on PO steroids. Will need O2 assessment prior to DC.   2. Influenza A - on Tamiflu.   3. 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 His very frequent PVC that are multifocal and shorts runs (longest 3 beats) are concerning for ischemia. There are negative T waves in The anterior precordial leads, no old ECG available for comparison. Once the patient recovered from acute infection a stress test should be performed. The patient should also be referred to EP for an ICD implantation (once the infection is cleared).   4. Nonischemic cardiomyopathy - etiology unclear. EF 30-35% by echo this adm (40% by cath 04/2013). Has been on ACEI/BB at home. He appears euvolemic. CXR improved since yesterday.  5. Anemia/thrombocytopenia -per IM.   Tobias Alexander, Rexene Edison 12/26/2013

## 2013-12-27 LAB — GLUCOSE, CAPILLARY
GLUCOSE-CAPILLARY: 233 mg/dL — AB (ref 70–99)
Glucose-Capillary: 152 mg/dL — ABNORMAL HIGH (ref 70–99)
Glucose-Capillary: 200 mg/dL — ABNORMAL HIGH (ref 70–99)
Glucose-Capillary: 161 mg/dL — ABNORMAL HIGH (ref 70–99)

## 2013-12-27 MED ORDER — FUROSEMIDE 40 MG PO TABS
40.0000 mg | ORAL_TABLET | Freq: Every day | ORAL | Status: DC
  Administered 2013-12-27 – 2013-12-29 (×3): 40 mg via ORAL
  Filled 2013-12-27 (×3): qty 1

## 2013-12-27 MED ORDER — INSULIN ASPART 100 UNIT/ML ~~LOC~~ SOLN
3.0000 [IU] | Freq: Three times a day (TID) | SUBCUTANEOUS | Status: DC
  Administered 2013-12-27 – 2013-12-30 (×6): 3 [IU] via SUBCUTANEOUS

## 2013-12-27 MED ORDER — METOPROLOL TARTRATE 25 MG PO TABS
25.0000 mg | ORAL_TABLET | Freq: Once | ORAL | Status: AC
  Administered 2013-12-27: 25 mg via ORAL
  Filled 2013-12-27: qty 1

## 2013-12-27 MED ORDER — ATORVASTATIN CALCIUM 20 MG PO TABS
20.0000 mg | ORAL_TABLET | Freq: Every day | ORAL | Status: DC
  Administered 2013-12-27 – 2014-01-01 (×6): 20 mg via ORAL
  Filled 2013-12-27 (×7): qty 1

## 2013-12-27 MED ORDER — METOPROLOL TARTRATE 50 MG PO TABS
50.0000 mg | ORAL_TABLET | Freq: Two times a day (BID) | ORAL | Status: DC
  Administered 2013-12-27 – 2014-01-01 (×10): 50 mg via ORAL
  Filled 2013-12-27 (×12): qty 1

## 2013-12-27 MED ORDER — DILTIAZEM HCL 30 MG PO TABS
30.0000 mg | ORAL_TABLET | Freq: Four times a day (QID) | ORAL | Status: DC
  Filled 2013-12-27 (×4): qty 1

## 2013-12-27 NOTE — Progress Notes (Signed)
Physical Therapy Treatment Patient Details Name: Seth BussingWesley Coyne MRN: [id number] DOB: Mar 24, 1933 Today's Date: 12/27/2013 Time: [id number] PT Time Calculation (min): 13 min  PT Assessment / Plan / Recommendation  History of Present Illness 80 [facility]. [facility] with DM, HTN, HPL, COPD presented with SOB, non productive cough, associated with DOE, chills after recent URI symptoms; patient found to have mild hypoxia in ED which improved after inhalers+oxygen.  (+) for Influenza A, put on droplet precautions.     PT Comments   Pt admitted with above. Pt currently with functional limitations due to the deficits related to his shortness of breath.  Patient is independent with bed mobility and transfers, but requires A for ambulation.  A is necessary for stability and to monitor vitals.  His vitals were consistent throughout session(listed below), though he did rapidly fatigue and require walking back into his room.  His dyspnea (3/4) caused moderate difficulty, but he can continue to the point of safely returning to room. Pt will benefit from skilled PT to increase their independence and safety with mobility to allow discharge to the venue listed below.    Follow Up Recommendations  No PT follow up;Supervision - Intermittent           Equipment Recommendations  None recommended by PT       Frequency Min 3X/week   Progress towards PT Goals Progress towards PT goals: Progressing toward goals  Plan Current plan remains appropriate    Precautions / Restrictions Precautions Precautions: Fall Precaution Comments: monitor O2 sats.  Pt did not use O2 PTA Restrictions Weight Bearing Restrictions: No   Pertinent Vitals/Pain Patient denies any chest pain/tightness.   Start of session: 98% spo2, 64 bpm Rest break: 98%, 98 bpm End of session: 98%, 98bpm On 2L via [place] throughout session   Mobility  Transfers Overall transfer level: Independent General transfer comment: close by  for safety Ambulation/Gait Ambulation/Gait assistance: Min guard Ambulation Distance (Feet): 150 Feet Assistive device: None Gait Pattern/deviations: Step-through pattern General Gait Details: On 2L O2 via [place] throughout session.  Patient used the O2 tank for stability as he says that for now he feels more comfortable holding on to something.Patient will benefit from attempting ambulation with AD such as cane next session to assess if he is safer and more stable.  Pt gets fatigued quickly with walking with dyspnea 3/4. Dyspnea 3/4 means that patient has moderated diffuculty, but can continue.      PT Goals (current goals can now be found in the care plan section) Acute Rehab PT Goals Patient Stated Goal: To feel better PT Goal Formulation: With patient Time For Goal Achievement: 01/08/14 Potential to Achieve Goals: Good  Visit Information  Last PT Received On: 12/27/13 Assistance Needed: +1 History of Present Illness: 80 [facility].o. [facility] with DM, HTN, HPL, COPD presented with SOB, non productive cough, associated with DOE, chills after recent URI symptoms; patient found to have mild hypoxia in ED which improved after inhalers+oxygen.  (+) for Influenza A, put on droplet precautions.      Subjective Data  Subjective: still feeling SOB Patient Stated Goal: To feel better   Cognition  Cognition Arousal/Alertness: Awake/alert Behavior During Therapy: WFL for tasks assessed/performed Overall Cognitive Status: Within Functional Limits for tasks assessed       End of Session PT - End of Session Equipment Utilized During Treatment: Oxygen;Gait belt Activity Tolerance: Patient limited by fatigue Patient left: in chair;with call  bell/phone within reach Nurse Communication: Mobility status   GP    Barrie Dunker, SPT Pager:  [phone]  Barrie Dunker 12/27/2013, 3:29 PM

## 2013-12-27 NOTE — Progress Notes (Signed)
Patient ID: Seth Hunt  male  LTR:[id number]    DOB: 1933/05/12    DOA: 12/23/2013  PCP: [name], MD  Assessment/Plan: Principal Problem:    Influenza A with COPD exacerbation, acute respiratory failure with hypoxia- Improving - Continue Tamiflu, prednisone, Xopenex and Atrovent TID,O2    Active Problems:    DM (diabetes mellitus) - BS somewhat elevated likely due to prednisone,continue Levemir, sliding scale insulin  - add meal coverage      HTN (hypertension): Stable      Troponin level elevated - Dr Lendell Caprice discussed with Dr. Delton See. Likely outpatient ischemia workup once pulmonary issues stabilized, likely as outpatient    Depression- stable     Thrombocytopenia, unspecified  recheck CBC  DVT Prophylaxis:Lovenox   Code Status: full code   Family Communication: discussed in detail with the patient   Disposition: 24- 48 hours     Subjective: Feels is somewhat better today still having productive cough and dyspnea. Appetite improving   Objective: Weight change:   Intake/Output Summary (Last 24 hours) at 12/27/13 1200 Last data filed at 12/26/13 2200  Gross per 24 hour  Intake      0 ml  Output    451 ml  Net   -451 ml   Blood pressure 133/69, pulse 112, temperature 98.2 F (36.8 C), temperature source Oral, resp. rate 20, height 5\' 8"  (1.727 m), weight 86.5 kg (190 lb 11.2 oz), SpO2 94.00%.  Physical Exam: General: Alert and awake, oriented x3, not in any acute distress. CVS: S1-S2 clear, no murmur rubs or gallops Chest:  decreased breath sound at the bases  Abdomen: soft nontender, nondistended, normal bowel sounds  Extremities: no cyanosis, clubbing or edema noted bilaterally   Lab Results: Basic Metabolic Panel:  Recent Labs Lab 12/23/13 1914 12/24/13 0530 12/25/13 0330  NA 142 140  --   K 4.4 4.0  --   CL 99 104  --   CO2 26 20  --   GLUCOSE 302* 297*  --   BUN 14 12  --   CREATININE 0.78 0.68  --   CALCIUM 9.4 8.4  --   MG  --   --   1.9   Liver Function Tests:  Recent Labs Lab 12/23/13 1914  AST 19  ALT 14  ALKPHOS 81  BILITOT 0.5  PROT 7.7  ALBUMIN 3.7   No results found for this basename: LIPASE, AMYLASE,  in the last 168 hours No results found for this basename: AMMONIA,  in the last 168 hours CBC:  Recent Labs Lab 12/23/13 1914 12/24/13 0530 12/25/13 0615  WBC 7.4 7.4 7.8  NEUTROABS 6.1  --   --   HGB 15.3 12.3* 11.9*  HCT 46.0 35.5* 34.7*  MCV 85.2 83.3 83.0  PLT 140* 117* 109*   Cardiac Enzymes:  Recent Labs Lab 12/23/13 2306 12/26/13 1315  TROPONINI 0.59* <0.30   BNP: No components found with this basename: POCBNP,  CBG:  Recent Labs Lab 12/26/13 1155 12/26/13 1700 12/26/13 2214 12/27/13 0745 12/27/13 1130  GLUCAP 201* 121* 152* 152* 233*     Micro Results: No results found for this or any previous visit (from the past 240 hour(s)).  Studies/Results: Dg Chest 2 View  12/26/2013   CLINICAL DATA:  Dyspnea and cough and congestion.  EXAM: CHEST  2 VIEW  COMPARISON:  Portable chest x-rays of December 23, 2012.  FINDINGS: The lungs are mildly hyperinflated. There are coarse lung markings in the retrocardiac region on  the left and to a lesser extent in the right infrahilar region which suggests atelectasis or early pneumonia. The cardiopericardial silhouette is normal in size. The pulmonary vascularity is not engorged. The mediastinum is normal in width. There is no pleural effusion. The observed portions of the bony thorax exhibit no acute abnormalities. There are surgical skin staples over the upper abdomen.  IMPRESSION: The findings are consistent with atelectasis or early interstitial pneumonia bilaterally in the lower lobes. This is superimposed upon findings of C OPD. There is no evidence of CHF nor pleural effusion.   Electronically Signed   By: David  [place]   On: 12/26/2013 12:27   Dg Chest Port 1 View  12/23/2013   CLINICAL DATA:  Shortness of breath  EXAM: PORTABLE CHEST -  1 VIEW  COMPARISON:  None.  FINDINGS: The cardiac shadow is within normal limits. The lungs are well aerated bilaterally. No focal infiltrate or sizable effusion is seen.  IMPRESSION: No acute abnormality noted.   Electronically Signed   By: Alcide CleverMark  Lukens M.D.   On: 12/23/2013 19:09    Medications: Scheduled Meds: . aspirin EC  81 mg Oral Daily  . atorvastatin  20 mg Oral q1800  . dextromethorphan-guaiFENesin  1 tablet Oral BID  . enoxaparin (LOVENOX) injection  40 mg Subcutaneous Daily  . FLUoxetine  20 mg Oral q morning - 10a  . furosemide  40 mg Oral Daily  . glimepiride  4 mg Oral Q breakfast  . insulin aspart  0-20 Units Subcutaneous TID WC  . insulin aspart  0-5 Units Subcutaneous QHS  . insulin detemir  10 Units Subcutaneous Daily  . ipratropium  0.5 mg Nebulization TID  . levalbuterol  0.63 mg Nebulization TID  . metoprolol tartrate  50 mg Oral BID  . oseltamivir  75 mg Oral BID  . predniSONE  40 mg Oral Q breakfast  . ramipril  10 mg Oral Daily  . sodium chloride  3 mL Intravenous Q12H  . tamsulosin  0.4 mg Oral q morning - 10a      LOS: 4 days   Jaydence Arnesen M.D. [facility] 12/27/2013, 12:00 PM Pager: [phone]  If 7PM-7AM, please contact night-coverage [email or web address] Password [email or web address]

## 2013-12-27 NOTE — Progress Notes (Addendum)
Subjective: Pt states that last night he developed chest tightness/heaviness which resolved.  He feels as though he is breathing better.  Objective: Vital signs in last 24 hours: Temp:  [98.1 F (36.7 C)-98.2 F (36.8 C)] 98.2 F (36.8 C) (01/14 0619) Pulse Rate:  [59-112] 112 (01/14 0619) Resp:  [16-20] 20 (01/14 0619) BP: (118-133)/(64-94) 133/69 mmHg (01/14 0619) SpO2:  [92 %-96 %] 94 % (01/14 0619) Last BM Date: 12/26/13  Intake/Output from previous day: 01/13 0701 - 01/14 0700 In: -  Out: 451 [Urine:450; Stool:1] Intake/Output this shift:   Medications Current Facility-Administered Medications  Medication Dose Route Frequency Provider Last Rate Last Dose  . acetaminophen (TYLENOL) tablet 650 mg  650 mg Oral Q6H PRN Esperanza SheetsUlugbek N Buriev, MD       Or  . acetaminophen (TYLENOL) suppository 650 mg  650 mg Rectal Q6H PRN Esperanza SheetsUlugbek N Buriev, MD      . aspirin EC tablet 81 mg  81 mg Oral Daily Esperanza SheetsUlugbek N Buriev, MD   81 mg at 12/26/13 1028  . dextromethorphan-guaiFENesin (MUCINEX DM) 30-600 MG per 12 hr tablet 1 tablet  1 tablet Oral BID Christiane Haorinna L Sullivan, MD   1 tablet at 12/26/13 2118  . enoxaparin (LOVENOX) injection 40 mg  40 mg Subcutaneous Daily Christiane Haorinna L Sullivan, MD   40 mg at 12/26/13 1028  . FLUoxetine (PROZAC) capsule 20 mg  20 mg Oral q morning - 10a Esperanza SheetsUlugbek N Buriev, MD   20 mg at 12/26/13 1028  . glimepiride (AMARYL) tablet 4 mg  4 mg Oral Q breakfast Christiane Haorinna L Sullivan, MD   4 mg at 12/27/13 0759  . insulin aspart (novoLOG) injection 0-20 Units  0-20 Units Subcutaneous TID WC Christiane Haorinna L Sullivan, MD   4 Units at 12/27/13 0759  . insulin aspart (novoLOG) injection 0-5 Units  0-5 Units Subcutaneous QHS Christiane Haorinna L Sullivan, MD      . insulin detemir (LEVEMIR) injection 10 Units  10 Units Subcutaneous Daily Christiane Haorinna L Sullivan, MD   10 Units at 12/26/13 1028  . ipratropium (ATROVENT) nebulizer solution 0.5 mg  0.5 mg Nebulization TID Christiane Haorinna L Sullivan, MD   0.5 mg at 12/26/13  2050  . levalbuterol (XOPENEX) nebulizer solution 0.63 mg  0.63 mg Nebulization TID Christiane Haorinna L Sullivan, MD   0.63 mg at 12/26/13 2050  . levalbuterol (XOPENEX) nebulizer solution 0.63 mg  0.63 mg Nebulization Q4H PRN Christiane Haorinna L Sullivan, MD      . metoprolol tartrate (LOPRESSOR) tablet 25 mg  25 mg Oral BID Esperanza SheetsUlugbek N Buriev, MD   25 mg at 12/26/13 2118  . ondansetron (ZOFRAN) tablet 4 mg  4 mg Oral Q6H PRN Esperanza SheetsUlugbek N Buriev, MD       Or  . ondansetron (ZOFRAN) injection 4 mg  4 mg Intravenous Q6H PRN Esperanza SheetsUlugbek N Buriev, MD      . oseltamivir (TAMIFLU) capsule 75 mg  75 mg Oral BID Christiane Haorinna L Sullivan, MD   75 mg at 12/26/13 2118  . predniSONE (DELTASONE) tablet 40 mg  40 mg Oral Q breakfast Christiane Haorinna L Sullivan, MD   40 mg at 12/27/13 0759  . ramipril (ALTACE) capsule 10 mg  10 mg Oral Daily Esperanza SheetsUlugbek N Buriev, MD   10 mg at 12/26/13 1257  . simvastatin (ZOCOR) tablet 40 mg  40 mg Oral QHS Esperanza SheetsUlugbek N Buriev, MD   40 mg at 12/26/13 2118  . sodium chloride 0.9 % injection 3 mL  3 mL Intravenous Q12H  Esperanza Sheets, MD   3 mL at 12/26/13 1609  . tamsulosin (FLOMAX) capsule 0.4 mg  0.4 mg Oral q morning - 10a Esperanza Sheets, MD   0.4 mg at 12/26/13 1028    PE: General appearance: alert, cooperative and no distress Lungs: Decreased BS bilaterally.  No wheeze or rhonchi. Heart: regular rate and rhythm Abdomen: Appears distended/tight. Dull inferiorly.  Extremities: No LEE Pulses: 2+ and symmetric  Skin: Warm and dry  Neurologic: Grossly normal   Lab Results:   Recent Labs  12/25/13 0615  WBC 7.8  HGB 11.9*  HCT 34.7*  PLT 109*   BMET No results found for this basename: NA, K, CL, CO2, GLUCOSE, BUN, CREATININE, CALCIUM,  in the last 72 hours PT/INR No results found for this basename: LABPROT, INR,  in the last 72 hours Cholesterol No results found for this basename: CHOL,  in the last 72 hours Cardiac Enzymes No components found with this basename: TROPONIN,  CKMB,    Studies/Results: @RISRSLT2 @   Assessment/Plan   Principal Problem:   Influenza A Active Problems:   COPD (chronic obstructive pulmonary disease)   DM (diabetes mellitus)   HTN (hypertension)   SOB (shortness of breath)   Troponin level elevated   Depression   Thrombocytopenia, unspecified   Acute respiratory failure with hypoxia   NICM (nonischemic cardiomyopathy)   Frequent PVCs  Plan:  Netf fluids:  -0.5L/-1.0L.  BNP [id number].  No CHF on CXR yesterday.  Gave one dose IV lasix yesterday. Peak troponin 0.59. Thought to be demand ischemia from COPD exacerbation and moderate nonobstructive CAD. BP stable. Frequent PVCs and he is having more tachycardia.  Consider increasing lopressor.  Stress test when recovered. EP consult for ICD given EF 30-35%.     LOS: 4 days    HAGER, BRYAN 12/27/2013 9:18 AM  The patient was seen, examined and discussed with Wilburt Finlay, PA-C and I agree with the above.    We will continue monitoring Troponin and follow. His very frequent PVC that are multifocal and shorts runs (longest 3 beats) are concerning for ischemia. There are negative T waves in the anterior precordial leads, no old ECG available for comparison. Once the patient recovered from acute infection a stress test should be performed.  The patient should also be referred to EP for an ICD implantation (once the infection is cleared), his LVEF is 30-35%, also he has very frequent ectopy, multifocal PVCs, and frequent nsVTs of 3-6 beats.  We will give increase the dose of metoprolol as his HR is slowly increasing and he has frequent nsVTs.    Tobias Alexander, Rexene Edison 12/27/2013

## 2013-12-27 NOTE — Progress Notes (Signed)
Read, reviewed, edited and agree with student's findings and recommendations.  Jahmil Macleod B. Rollande Thursby, PT, DPT #[phone]  

## 2013-12-28 LAB — GLUCOSE, CAPILLARY
GLUCOSE-CAPILLARY: 110 mg/dL — AB (ref 70–99)
GLUCOSE-CAPILLARY: 200 mg/dL — AB (ref 70–99)
Glucose-Capillary: 114 mg/dL — ABNORMAL HIGH (ref 70–99)
Glucose-Capillary: 184 mg/dL — ABNORMAL HIGH (ref 70–99)

## 2013-12-28 LAB — CBC
HEMATOCRIT: 44.2 % (ref 39.0–52.0)
Hemoglobin: 15 g/dL (ref 13.0–17.0)
MCH: 29 pg (ref 26.0–34.0)
MCHC: 33.9 g/dL (ref 30.0–36.0)
MCV: 85.3 fL (ref 78.0–100.0)
Platelets: 208 10*3/uL (ref 150–400)
RBC: 5.18 MIL/uL (ref 4.22–5.81)
RDW: 13.1 % (ref 11.5–15.5)
WBC: 9.2 10*3/uL (ref 4.0–10.5)

## 2013-12-28 LAB — BASIC METABOLIC PANEL
BUN: 31 mg/dL — AB (ref 6–23)
CHLORIDE: 97 meq/L (ref 96–112)
CO2: 30 meq/L (ref 19–32)
CREATININE: 0.78 mg/dL (ref 0.50–1.35)
Calcium: 9.4 mg/dL (ref 8.4–10.5)
GFR calc Af Amer: >90 mL/min (ref >90)
GFR calc non Af Amer: 84 mL/min — ABNORMAL LOW (ref >90)
Glucose, Bld: 85 mg/dL (ref 70–99)
Potassium: 3.2 mEq/L — ABNORMAL LOW (ref 3.7–5.3)
Sodium: 142 mEq/L (ref 137–147)

## 2013-12-28 LAB — PRO B NATRIURETIC PEPTIDE: Pro B Natriuretic peptide (BNP): 3479 pg/mL — ABNORMAL HIGH (ref 0–450)

## 2013-12-28 MED ORDER — MOMETASONE FURO-FORMOTEROL FUM 100-5 MCG/ACT IN AERO
2.0000 | INHALATION_SPRAY | Freq: Two times a day (BID) | RESPIRATORY_TRACT | Status: DC
  Filled 2013-12-28: qty 8.8

## 2013-12-28 MED ORDER — LEVALBUTEROL HCL 0.63 MG/3ML IN NEBU
0.6300 mg | INHALATION_SOLUTION | Freq: Four times a day (QID) | RESPIRATORY_TRACT | Status: DC
  Administered 2013-12-28 – 2014-01-02 (×21): 0.63 mg via RESPIRATORY_TRACT
  Filled 2013-12-28 (×41): qty 3

## 2013-12-28 MED ORDER — MOMETASONE FURO-FORMOTEROL FUM 100-5 MCG/ACT IN AERO
2.0000 | INHALATION_SPRAY | Freq: Two times a day (BID) | RESPIRATORY_TRACT | Status: DC
  Administered 2013-12-28 – 2014-01-02 (×10): 2 via RESPIRATORY_TRACT
  Filled 2013-12-28 (×2): qty 8.8

## 2013-12-28 MED ORDER — IPRATROPIUM BROMIDE 0.02 % IN SOLN
0.5000 mg | Freq: Four times a day (QID) | RESPIRATORY_TRACT | Status: DC
  Administered 2013-12-28 – 2014-01-02 (×21): 0.5 mg via RESPIRATORY_TRACT
  Filled 2013-12-28 (×22): qty 2.5

## 2013-12-28 NOTE — Progress Notes (Signed)
Patient ID: Seth Hunt  male  XEN:[id number]    DOB: October 17, 1933    DOA: 12/23/2013  PCP: [name], MD  Assessment/Plan: Principal Problem:    Influenza A with COPD exacerbation, acute respiratory failure with hypoxia- Improving, still some what wheezing and weak - Continue Tamiflu, prednisone, will taper at discharge - Continue Xopenex and Atrovent qid,O2 , add dulera   Active Problems:    DM (diabetes mellitus) - BS somewhat elevated likely due to prednisone,continue Levemir, sliding scale insulin  - added meal coverage      HTN (hypertension): Stable      Troponin level elevated, likely demand ischemia, EF 30-35% by echo this adm   - Cardiology following - Continue aspirin, statin, beta blocker - Recommended outpatient stress test once pulmonology issues are resolved - Per cardiology, Outpatient referral to EP for ICD implantation    Depression- stable     Thrombocytopenia, unspecified- improving   DVT Prophylaxis:Lovenox   Code Status: full code   Family Communication: discussed in detail with the patient   Disposition:  hopefully tomorrow   Subjective:  feels somewhat better, still having some wheezing and coughing   Objective: Weight change:  No intake or output data in the 24 hours ending 12/28/13 1102 Blood pressure 115/54, pulse 91, temperature 98 F (36.7 C), temperature source Oral, resp. rate 18, height 5\' 8"  (1.727 m), weight 86.455 kg (190 lb 9.6 oz), SpO2 93.00%.  Physical Exam: General: Alert and awake, oriented x3, not in any acute distress. CVS: S1-S2 clear, no murmur rubs or gallops Chest:  decreased breath sound at the bases , mild scattered wheezing bilaterally  Abdomen: soft nontender, nondistended, normal bowel sounds  Extremities: no cyanosis, clubbing or edema noted bilaterally   Lab Results: Basic Metabolic Panel:  Recent Labs Lab 12/24/13 0530 12/25/13 0330 12/28/13 0541  NA 140  --  142  K 4.0  --  3.2*  CL 104  --  97  CO2  20  --  30  GLUCOSE 297*  --  85  BUN 12  --  31*  CREATININE 0.68  --  0.78  CALCIUM 8.4  --  9.4  MG  --  1.9  --    Liver Function Tests:  Recent Labs Lab 12/23/13 1914  AST 19  ALT 14  ALKPHOS 81  BILITOT 0.5  PROT 7.7  ALBUMIN 3.7   No results found for this basename: LIPASE, AMYLASE,  in the last 168 hours No results found for this basename: AMMONIA,  in the last 168 hours CBC:  Recent Labs Lab 12/23/13 1914  12/25/13 0615 12/28/13 0541  WBC 7.4  < > 7.8 9.2  NEUTROABS 6.1  --   --   --   HGB 15.3  < > 11.9* 15.0  HCT 46.0  < > 34.7* 44.2  MCV 85.2  < > 83.0 85.3  PLT 140*  < > 109* 208  < > = values in this interval not displayed. Cardiac Enzymes:  Recent Labs Lab 12/23/13 2306 12/26/13 1315  TROPONINI 0.59* <0.30   BNP: No components found with this basename: POCBNP,  CBG:  Recent Labs Lab 12/27/13 0745 12/27/13 1130 12/27/13 1647 12/27/13 2217 12/28/13 0751  GLUCAP 152* 233* 200* 161* 110*     Micro Results: No results found for this or any previous visit (from the past 240 hour(s)).  Studies/Results: Dg Chest 2 View  12/26/2013   CLINICAL DATA:  Dyspnea and cough and congestion.  EXAM: CHEST  2 VIEW  COMPARISON:  Portable chest x-rays of December 23, 2012.  FINDINGS: The lungs are mildly hyperinflated. There are coarse lung markings in the retrocardiac region on the left and to a lesser extent in the right infrahilar region which suggests atelectasis or early pneumonia. The cardiopericardial silhouette is normal in size. The pulmonary vascularity is not engorged. The mediastinum is normal in width. There is no pleural effusion. The observed portions of the bony thorax exhibit no acute abnormalities. There are surgical skin staples over the upper abdomen.  IMPRESSION: The findings are consistent with atelectasis or early interstitial pneumonia bilaterally in the lower lobes. This is superimposed upon findings of C OPD. There is no evidence of CHF  nor pleural effusion.   Electronically Signed   By: David  [place]azil[place]   On: 12/26/2013 12:27   Dg Chest Port 1 View  12/23/2013   CLINICAL DATA:  Shortness of breath  EXAM: PORTABLE CHEST - 1 VIEW  COMPARISON:  None.  FINDINGS: The cardiac shadow is within normal limits. The lungs are well aerated bilaterally. No focal infiltrate or sizable effusion is seen.  IMPRESSION: No acute abnormality noted.   Electronically Signed   By: Alcide CleverMark  Lukens M.D.   On: 12/23/2013 19:09    Medications: Scheduled Meds: . aspirin EC  81 mg Oral Daily  . atorvastatin  20 mg Oral q1800  . dextromethorphan-guaiFENesin  1 tablet Oral BID  . enoxaparin (LOVENOX) injection  40 mg Subcutaneous Daily  . FLUoxetine  20 mg Oral q morning - 10a  . furosemide  40 mg Oral Daily  . glimepiride  4 mg Oral Q breakfast  . insulin aspart  0-20 Units Subcutaneous TID WC  . insulin aspart  0-5 Units Subcutaneous QHS  . insulin aspart  3 Units Subcutaneous TID WC  . insulin detemir  10 Units Subcutaneous Daily  . ipratropium  0.5 mg Nebulization QID  . levalbuterol  0.63 mg Nebulization QID  . metoprolol tartrate  50 mg Oral BID  . oseltamivir  75 mg Oral BID  . predniSONE  40 mg Oral Q breakfast  . ramipril  10 mg Oral Daily  . sodium chloride  3 mL Intravenous Q12H  . tamsulosin  0.4 mg Oral q morning - 10a      LOS: 5 days   RAI,RIPUDEEP M.D. [facility] 12/28/2013, 11:02 AM Pager: [phone]  If 7PM-7AM, please contact night-coverage [email or web address] Password [email or web address]

## 2013-12-28 NOTE — Progress Notes (Signed)
Physical Therapy Treatment Patient Details Name: Seth Hunt MRN: [id number] DOB: 1933/04/01 Today's Date: 12/28/2013 Time: 4327-6147 PT Time Calculation (min): 15 min  PT Assessment / Plan / Recommendation  History of Present Illness 79 y.o. male admitted to [facility] on 12/23/13 with DM, HTN, HPL, COPD presented with SOB, non productive cough, associated with DOE, chills after recent URI symptoms; patient found to have mild hypoxia in ED which improved after inhalers+oxygen.  (+) for Influenza A, put on droplet precautions.     PT Comments   Patient agreeable to ambulation. Progressing well with walking and endurance  Follow Up Recommendations  No PT follow up;Supervision - Intermittent     Does the patient have the potential to tolerate intense rehabilitation     Barriers to Discharge        Equipment Recommendations  None recommended by PT    Recommendations for Other Services    Frequency Min 3X/week   Progress towards PT Goals Progress towards PT goals: Progressing toward goals  Plan Current plan remains appropriate    Precautions / Restrictions Precautions Precautions: Fall Precaution Comments: monitor O2 sats.  Pt did not use O2 PTA   Pertinent Vitals/Pain Denied pain SATURATION QUALIFICATIONS: (This note is used to comply with regulatory documentation for home oxygen)  Patient Saturations on Room Air at Rest = 89%  Patient Saturations on Room Air while Ambulating =87%  Patient Saturations on 2 Liters of oxygen while Ambulating = 96%     Mobility  Transfers Overall transfer level: Independent Equipment used: None Ambulation/Gait Ambulation/Gait assistance: Min guard Ambulation Distance (Feet): 220 Feet Assistive device: None Gait velocity: decreased General Gait Details: Patient continued to use O2 tank for stability as no cane availible. Minguard only for turns. Cues to slow and control turning.     Exercises     PT Diagnosis:    PT Problem List:   PT  Treatment Interventions:     PT Goals (current goals can now be found in the care plan section)    Visit Information  Last PT Received On: 12/28/13 Assistance Needed: +1 History of Present Illness: 80 y.o. male admitted to [facility] on 12/23/13 with DM, HTN, HPL, COPD presented with SOB, non productive cough, associated with DOE, chills after recent URI symptoms; patient found to have mild hypoxia in ED which improved after inhalers+oxygen.  (+) for Influenza A, put on droplet precautions.      Subjective Data      Cognition  Cognition Arousal/Alertness: Awake/alert Behavior During Therapy: WFL for tasks assessed/performed Overall Cognitive Status: Within Functional Limits for tasks assessed    Balance     End of Session PT - End of Session Equipment Utilized During Treatment: Oxygen;Gait belt Activity Tolerance: Patient tolerated treatment well Patient left: in chair;with call bell/phone within reach Nurse Communication: Mobility status   GP     Fredrich Birks 12/28/2013, 2:51 PM 12/28/2013 Fredrich Birks PTA [phone] pager [phone] office

## 2013-12-28 NOTE — Progress Notes (Signed)
Subjective: Improved SOB, no CP in the last 30 hours.  Objective: Vital signs in last 24 hours: Temp:  [97.8 F (36.6 C)-98.3 F (36.8 C)] 98 F (36.7 C) (01/15 0636) Pulse Rate:  [42-100] 91 (01/15 0636) Resp:  [18-20] 18 (01/15 0636) BP: (104-115)/(54-82) 115/54 mmHg (01/15 0636) SpO2:  [93 %-97 %] 97 % (01/15 1116) Weight:  [190 lb 9.6 oz (86.455 kg)] 190 lb 9.6 oz (86.455 kg) (01/15 0636) Last BM Date: 12/27/13    Medications Current Facility-Administered Medications  Medication Dose Route Frequency Provider Last Rate Last Dose  . acetaminophen (TYLENOL) tablet 650 mg  650 mg Oral Q6H PRN Esperanza Sheets, MD       Or  . acetaminophen (TYLENOL) suppository 650 mg  650 mg Rectal Q6H PRN Esperanza Sheets, MD      . aspirin EC tablet 81 mg  81 mg Oral Daily Esperanza Sheets, MD   81 mg at 12/28/13 1025  . atorvastatin (LIPITOR) tablet 20 mg  20 mg Oral q1800 Ripudeep K Rai, MD   20 mg at 12/27/13 1150  . dextromethorphan-guaiFENesin (MUCINEX DM) 30-600 MG per 12 hr tablet 1 tablet  1 tablet Oral BID Christiane Ha, MD   1 tablet at 12/28/13 1026  . enoxaparin (LOVENOX) injection 40 mg  40 mg Subcutaneous Daily Christiane Ha, MD   40 mg at 12/28/13 1025  . FLUoxetine (PROZAC) capsule 20 mg  20 mg Oral q morning - 10a Esperanza Sheets, MD   20 mg at 12/28/13 1026  . furosemide (LASIX) tablet 40 mg  40 mg Oral Daily Lars Masson, MD   40 mg at 12/28/13 1026  . glimepiride (AMARYL) tablet 4 mg  4 mg Oral Q breakfast Christiane Ha, MD   4 mg at 12/28/13 2924  . insulin aspart (novoLOG) injection 0-20 Units  0-20 Units Subcutaneous TID WC Christiane Ha, MD   4 Units at 12/27/13 1655  . insulin aspart (novoLOG) injection 0-5 Units  0-5 Units Subcutaneous QHS Christiane Ha, MD   2 Units at 12/27/13 2252  . insulin aspart (novoLOG) injection 3 Units  3 Units Subcutaneous TID WC Ripudeep Jenna Luo, MD   3 Units at 12/27/13 1655  . insulin detemir (LEVEMIR)  injection 10 Units  10 Units Subcutaneous Daily Christiane Ha, MD   10 Units at 12/28/13 1025  . ipratropium (ATROVENT) nebulizer solution 0.5 mg  0.5 mg Nebulization QID Ripudeep K Rai, MD   0.5 mg at 12/28/13 1114  . levalbuterol (XOPENEX) nebulizer solution 0.63 mg  0.63 mg Nebulization Q4H PRN Christiane Ha, MD      . levalbuterol Pauline Aus) nebulizer solution 0.63 mg  0.63 mg Nebulization QID Ripudeep Jenna Luo, MD   0.63 mg at 12/28/13 1114  . metoprolol (LOPRESSOR) tablet 50 mg  50 mg Oral BID Lars Masson, MD   50 mg at 12/28/13 1026  . mometasone-formoterol (DULERA) 100-5 MCG/ACT inhaler 2 puff  2 puff Inhalation BID Ripudeep K Rai, MD      . ondansetron (ZOFRAN) tablet 4 mg  4 mg Oral Q6H PRN Esperanza Sheets, MD       Or  . ondansetron (ZOFRAN) injection 4 mg  4 mg Intravenous Q6H PRN Esperanza Sheets, MD      . oseltamivir (TAMIFLU) capsule 75 mg  75 mg Oral BID Christiane Ha, MD   75 mg at 12/28/13 1026  .  predniSONE (DELTASONE) tablet 40 mg  40 mg Oral Q breakfast Christiane Haorinna L Sullivan, MD   40 mg at 12/28/13 0842  . ramipril (ALTACE) capsule 10 mg  10 mg Oral Daily Esperanza SheetsUlugbek N Buriev, MD   10 mg at 12/28/13 1026  . sodium chloride 0.9 % injection 3 mL  3 mL Intravenous Q12H Esperanza SheetsUlugbek N Buriev, MD   3 mL at 12/28/13 1027  . tamsulosin (FLOMAX) capsule 0.4 mg  0.4 mg Oral q morning - 10a Esperanza SheetsUlugbek N Buriev, MD   0.4 mg at 12/28/13 1026    PE: General appearance: alert, cooperative and no distress Lungs: Decreased BS bilaterally.  No wheeze or rhonchi. Heart: regular rate and rhythm Abdomen: Appears distended/tight. Dull inferiorly.  Extremities: No LEE Pulses: 2+ and symmetric  Skin: Warm and dry  Neurologic: Grossly normal   Lab Results:   Recent Labs  12/28/13 0541  WBC 9.2  HGB 15.0  HCT 44.2  PLT 208   BMET  Recent Labs  12/28/13 0541  NA 142  K 3.2*  CL 97  CO2 30  GLUCOSE 85  BUN 31*  CREATININE 0.78  CALCIUM 9.4   PT/INR No results found  for this basename: LABPROT, INR,  in the last 72 hours Cholesterol No results found for this basename: CHOL,  in the last 72 hours Cardiac Enzymes No components found with this basename: TROPONIN,  CKMB,   Studies/Results: @RISRSLT2 @   Assessment/Plan   Principal Problem:   Influenza A Active Problems:   COPD (chronic obstructive pulmonary disease)   DM (diabetes mellitus)   HTN (hypertension)   Troponin level elevated   Depression   Thrombocytopenia, unspecified   Acute respiratory failure with hypoxia   NICM (nonischemic cardiomyopathy)   Frequent PVCs  1. Influenza - not wheezing any more, on Tamiflu, albuterol, atrovent and oral steroids, improving  2. CAD  - s/p NSTEMI - possibly demand ischemia, new ECG changes, stress testing once acute infection has resolved  3. Acute on chronic systolic CHF - high BNP, improving with Lasix, negative fluid balnce, still mildly fluid overloaded, we will continue Lasix.  Chronically low LVEF, will need ICD, for now [employer] Life vest, EP was consulted. Ectopy decreased after increasing metoprolol.    LOS: 5 days    Tobias AlexanderELSON, Joanthan Hlavacek, Rexene EdisonH 12/28/2013 11:49 AM

## 2013-12-29 LAB — CBC
HEMATOCRIT: 38 % — AB (ref 39.0–52.0)
HEMOGLOBIN: 12.9 g/dL — AB (ref 13.0–17.0)
MCH: 28.5 pg (ref 26.0–34.0)
MCHC: 33.9 g/dL (ref 30.0–36.0)
MCV: 83.9 fL (ref 78.0–100.0)
Platelets: 191 10*3/uL (ref 150–400)
RBC: 4.53 MIL/uL (ref 4.22–5.81)
RDW: 12.9 % (ref 11.5–15.5)
WBC: 7.5 10*3/uL (ref 4.0–10.5)

## 2013-12-29 LAB — BASIC METABOLIC PANEL
BUN: 30 mg/dL — AB (ref 6–23)
CHLORIDE: 101 meq/L (ref 96–112)
CO2: 27 mEq/L (ref 19–32)
Calcium: 8.7 mg/dL (ref 8.4–10.5)
Creatinine, Ser: 0.77 mg/dL (ref 0.50–1.35)
GFR calc Af Amer: >90 mL/min (ref >90)
GFR calc non Af Amer: 85 mL/min — ABNORMAL LOW (ref >90)
GLUCOSE: 171 mg/dL — AB (ref 70–99)
POTASSIUM: 3 meq/L — AB (ref 3.7–5.3)
Sodium: 142 mEq/L (ref 137–147)

## 2013-12-29 LAB — GLUCOSE, CAPILLARY
GLUCOSE-CAPILLARY: 187 mg/dL — AB (ref 70–99)
GLUCOSE-CAPILLARY: 239 mg/dL — AB (ref 70–99)
Glucose-Capillary: 152 mg/dL — ABNORMAL HIGH (ref 70–99)
Glucose-Capillary: 178 mg/dL — ABNORMAL HIGH (ref 70–99)

## 2013-12-29 MED ORDER — POTASSIUM CHLORIDE CRYS ER 20 MEQ PO TBCR
40.0000 meq | EXTENDED_RELEASE_TABLET | Freq: Once | ORAL | Status: AC
  Administered 2013-12-29: 40 meq via ORAL
  Filled 2013-12-29: qty 2

## 2013-12-29 MED ORDER — PREDNISONE 20 MG PO TABS
30.0000 mg | ORAL_TABLET | Freq: Every day | ORAL | Status: DC
  Administered 2013-12-30 – 2014-01-01 (×3): 30 mg via ORAL
  Filled 2013-12-29 (×4): qty 1

## 2013-12-29 MED ORDER — POTASSIUM CHLORIDE CRYS ER 20 MEQ PO TBCR
40.0000 meq | EXTENDED_RELEASE_TABLET | Freq: Two times a day (BID) | ORAL | Status: DC
  Administered 2013-12-29 – 2013-12-31 (×5): 40 meq via ORAL
  Filled 2013-12-29 (×7): qty 2

## 2013-12-29 MED ORDER — FUROSEMIDE 40 MG PO TABS
40.0000 mg | ORAL_TABLET | Freq: Two times a day (BID) | ORAL | Status: DC
  Administered 2013-12-29 – 2014-01-02 (×8): 40 mg via ORAL
  Filled 2013-12-29 (×10): qty 1

## 2013-12-29 NOTE — Progress Notes (Signed)
Patient ID: Nolon BussingWesley Buxbaum  male  WUJ:[id number]    DOB: 1933/11/20    DOA: 12/23/2013  PCP: Harlan StainsOE,[name], MD  Assessment/Plan: Principal Problem:   Acute hypoxic respiratory failure secondary to 1)  Influenza A with COPD exacerbation- Improving slowly, still has some hypoxia, on 2 L O2 via nasal cannula - Completed Tamiflu, prednisone, taper to 30 mg tomorrow - Continue Xopenex and Atrovent qid,O2 , dulera  - Will need home O2, 2 L  2) acute on chronic systolic and diastolic CHF - Cardiology following, increase the Lasix to 40 mg BID today, - 2-D echo EF 30-35% with grade 1 diastolic dysfunction - Recommended ICD, for now life vest  NSTEMI/ Troponin level elevated, likely demand ischemia, EF 30-35% by echo this adm  - Cardiology following, Continue aspirin, statin, beta blocker - Recommended outpatient stress test once pulmonology issues are resolved - Per cardiology, Outpatient referral to EP for ICD implantation  Hypokalemia: Replaced    DM (diabetes mellitus) - BS somewhat elevated likely due to prednisone,continue Levemir, sliding scale insulin  - added meal coverage      HTN (hypertension): Stable      Depression- stable     Thrombocytopenia, unspecified- improving   DVT Prophylaxis:Lovenox   Code Status: full code   Family Communication: discussed in detail with the patient   Disposition:  hopefully tomorrow   Subjective:  feels somewhat better, still on 2 L O2,  Objective: Weight change:   Intake/Output Summary (Last 24 hours) at 12/29/13 1210 Last data filed at 12/28/13 2145  Gross per 24 hour  Intake      3 ml  Output    100 ml  Net    -97 ml   Blood pressure 130/74, pulse 102, temperature 98.1 F (36.7 C), temperature source Oral, resp. rate 18, height 5\' 8"  (1.727 m), weight 86.455 kg (190 lb 9.6 oz), SpO2 95.00%.  Physical Exam: General: Alert and awake, oriented x3, NAD CVS: S1-S2 clear, no murmur rubs or gallops Chest:  decreased breath sound at  the bases , mild scattered wheezing bilaterally  Abdomen: soft nontender, nondistended, normal bowel sounds  Extremities: no cyanosis, clubbing or edema noted bilaterally   Lab Results: Basic Metabolic Panel:  Recent Labs Lab 12/25/13 0330 12/28/13 0541 12/29/13 0447  NA  --  142 142  K  --  3.2* 3.0*  CL  --  97 101  CO2  --  30 27  GLUCOSE  --  85 171*  BUN  --  31* 30*  CREATININE  --  0.78 0.77  CALCIUM  --  9.4 8.7  MG 1.9  --   --    Liver Function Tests:  Recent Labs Lab 12/23/13 1914  AST 19  ALT 14  ALKPHOS 81  BILITOT 0.5  PROT 7.7  ALBUMIN 3.7   No results found for this basename: LIPASE, AMYLASE,  in the last 168 hours No results found for this basename: AMMONIA,  in the last 168 hours CBC:  Recent Labs Lab 12/23/13 1914  12/28/13 0541 12/29/13 0447  WBC 7.4  < > 9.2 7.5  NEUTROABS 6.1  --   --   --   HGB 15.3  < > 15.0 12.9*  HCT 46.0  < > 44.2 38.0*  MCV 85.2  < > 85.3 83.9  PLT 140*  < > 208 191  < > = values in this interval not displayed. Cardiac Enzymes:  Recent Labs Lab 12/23/13 2306 12/26/13 1315  TROPONINI  0.59* <0.30   BNP: No components found with this basename: POCBNP,  CBG:  Recent Labs Lab 12/28/13 1153 12/28/13 1645 12/28/13 2132 12/29/13 0743 12/29/13 1156  GLUCAP 200* 114* 184* 152* 178*     Micro Results: No results found for this or any previous visit (from the past 240 hour(s)).  Studies/Results: Dg Chest 2 View  12/26/2013   CLINICAL DATA:  Dyspnea and cough and congestion.  EXAM: CHEST  2 VIEW  COMPARISON:  Portable chest x-rays of December 23, 2012.  FINDINGS: The lungs are mildly hyperinflated. There are coarse lung markings in the retrocardiac region on the left and to a lesser extent in the right infrahilar region which suggests atelectasis or early pneumonia. The cardiopericardial silhouette is normal in size. The pulmonary vascularity is not engorged. The mediastinum is normal in width. There is no  pleural effusion. The observed portions of the bony thorax exhibit no acute abnormalities. There are surgical skin staples over the upper abdomen.  IMPRESSION: The findings are consistent with atelectasis or early interstitial pneumonia bilaterally in the lower lobes. This is superimposed upon findings of C OPD. There is no evidence of CHF nor pleural effusion.   Electronically Signed   By: David  [place]   On: 12/26/2013 12:27   Dg Chest Port 1 View  12/23/2013   CLINICAL DATA:  Shortness of breath  EXAM: PORTABLE CHEST - 1 VIEW  COMPARISON:  None.  FINDINGS: The cardiac shadow is within normal limits. The lungs are well aerated bilaterally. No focal infiltrate or sizable effusion is seen.  IMPRESSION: No acute abnormality noted.   Electronically Signed   By: Alcide Clever M.D.   On: 12/23/2013 19:09    Medications: Scheduled Meds: . aspirin EC  81 mg Oral Daily  . atorvastatin  20 mg Oral q1800  . dextromethorphan-guaiFENesin  1 tablet Oral BID  . enoxaparin (LOVENOX) injection  40 mg Subcutaneous Daily  . FLUoxetine  20 mg Oral q morning - 10a  . furosemide  40 mg Oral BID  . glimepiride  4 mg Oral Q breakfast  . insulin aspart  0-20 Units Subcutaneous TID WC  . insulin aspart  0-5 Units Subcutaneous QHS  . insulin aspart  3 Units Subcutaneous TID WC  . insulin detemir  10 Units Subcutaneous Daily  . ipratropium  0.5 mg Nebulization QID  . levalbuterol  0.63 mg Nebulization QID  . metoprolol tartrate  50 mg Oral BID  . mometasone-formoterol  2 puff Inhalation BID  . potassium chloride  40 mEq Oral BID  . potassium chloride  40 mEq Oral Once  . predniSONE  40 mg Oral Q breakfast  . ramipril  10 mg Oral Daily  . sodium chloride  3 mL Intravenous Q12H  . tamsulosin  0.4 mg Oral q morning - 10a      LOS: 6 days   RAI,RIPUDEEP M.D. [facility] 12/29/2013, 12:10 PM Pager: [phone]  If 7PM-7AM, please contact night-coverage [email or web address] Password [email or web address]

## 2013-12-29 NOTE — Progress Notes (Signed)
Patient O2 sats 92% on RA while ambulating with Chanetta Marshall , NT.

## 2013-12-29 NOTE — Progress Notes (Addendum)
Subjective: Improved SOB, no CP in the last 2 days.  Objective: Vital signs in last 24 hours: Temp:  [97.6 F (36.4 C)-98.1 F (36.7 C)] 98.1 F (36.7 C) (01/16 0645) Pulse Rate:  [78-102] 102 (01/16 1020) Resp:  [16-20] 18 (01/16 0645) BP: (110-130)/(52-74) 130/74 mmHg (01/16 1020) SpO2:  [91 %-96 %] 95 % (01/16 0904) Last BM Date: 12/29/13    Medications Current Facility-Administered Medications  Medication Dose Route Frequency Provider Last Rate Last Dose  . acetaminophen (TYLENOL) tablet 650 mg  650 mg Oral Q6H PRN Esperanza Sheets, MD       Or  . acetaminophen (TYLENOL) suppository 650 mg  650 mg Rectal Q6H PRN Esperanza Sheets, MD      . aspirin EC tablet 81 mg  81 mg Oral Daily Esperanza Sheets, MD   81 mg at 12/29/13 1019  . atorvastatin (LIPITOR) tablet 20 mg  20 mg Oral q1800 Ripudeep K Rai, MD   20 mg at 12/28/13 1800  . dextromethorphan-guaiFENesin (MUCINEX DM) 30-600 MG per 12 hr tablet 1 tablet  1 tablet Oral BID Christiane Ha, MD   1 tablet at 12/29/13 1019  . enoxaparin (LOVENOX) injection 40 mg  40 mg Subcutaneous Daily Christiane Ha, MD   40 mg at 12/28/13 1025  . FLUoxetine (PROZAC) capsule 20 mg  20 mg Oral q morning - 10a Esperanza Sheets, MD   20 mg at 12/29/13 1020  . furosemide (LASIX) tablet 40 mg  40 mg Oral Daily Lars Masson, MD   40 mg at 12/29/13 1020  . glimepiride (AMARYL) tablet 4 mg  4 mg Oral Q breakfast Christiane Ha, MD   4 mg at 12/29/13 0840  . insulin aspart (novoLOG) injection 0-20 Units  0-20 Units Subcutaneous TID WC Christiane Ha, MD   4 Units at 12/29/13 [phone]  . insulin aspart (novoLOG) injection 0-5 Units  0-5 Units Subcutaneous QHS Christiane Ha, MD   2 Units at 12/27/13 2252  . insulin aspart (novoLOG) injection 3 Units  3 Units Subcutaneous TID WC Ripudeep Jenna Luo, MD   3 Units at 12/28/13 1318  . insulin detemir (LEVEMIR) injection 10 Units  10 Units Subcutaneous Daily Christiane Ha, MD   10 Units  at 12/29/13 1022  . ipratropium (ATROVENT) nebulizer solution 0.5 mg  0.5 mg Nebulization QID Ripudeep K Rai, MD   0.5 mg at 12/29/13 0855  . levalbuterol (XOPENEX) nebulizer solution 0.63 mg  0.63 mg Nebulization Q4H PRN Christiane Ha, MD      . levalbuterol Pauline Aus) nebulizer solution 0.63 mg  0.63 mg Nebulization QID Ripudeep Jenna Luo, MD   0.63 mg at 12/29/13 0855  . metoprolol (LOPRESSOR) tablet 50 mg  50 mg Oral BID Lars Masson, MD   50 mg at 12/29/13 1020  . mometasone-formoterol (DULERA) 100-5 MCG/ACT inhaler 2 puff  2 puff Inhalation BID Ripudeep Jenna Luo, MD   2 puff at 12/29/13 0903  . ondansetron (ZOFRAN) tablet 4 mg  4 mg Oral Q6H PRN Esperanza Sheets, MD       Or  . ondansetron (ZOFRAN) injection 4 mg  4 mg Intravenous Q6H PRN Esperanza Sheets, MD      . predniSONE (DELTASONE) tablet 40 mg  40 mg Oral Q breakfast Christiane Ha, MD   40 mg at 12/29/13 0840  . ramipril (ALTACE) capsule 10 mg  10 mg Oral Daily Ulugbek N  Buriev, MD   10 mg at 12/29/13 1021  . sodium chloride 0.9 % injection 3 mL  3 mL Intravenous Q12H Esperanza SheetsUlugbek N Buriev, MD   3 mL at 12/29/13 1021  . tamsulosin (FLOMAX) capsule 0.4 mg  0.4 mg Oral q morning - 10a Esperanza SheetsUlugbek N Buriev, MD   0.4 mg at 12/29/13 1021    PE: General appearance: alert, cooperative and no distress Lungs: Decreased BS bilaterally.  + wheezing and crackles B/L Heart: regular rate and rhythm Abdomen: Appears distended/tight. Dull inferiorly.  Extremities: No LEE Pulses: 2+ and symmetric  Skin: Warm and dry  Neurologic: Grossly normal   Lab Results:   Recent Labs  12/28/13 0541 12/29/13 0447  WBC 9.2 7.5  HGB 15.0 12.9*  HCT 44.2 38.0*  PLT 208 191   BMET  Recent Labs  12/28/13 0541 12/29/13 0447  NA 142 142  K 3.2* 3.0*  CL 97 101  CO2 30 27  GLUCOSE 85 171*  BUN 31* 30*  CREATININE 0.78 0.77  CALCIUM 9.4 8.7     Assessment/Plan   Principal Problem:   Influenza A Active Problems:   COPD (chronic  obstructive pulmonary disease)   DM (diabetes mellitus)   HTN (hypertension)   Troponin level elevated   Depression   Thrombocytopenia, unspecified   Acute respiratory failure with hypoxia   NICM (nonischemic cardiomyopathy)   Frequent PVCs  1. Influenza - still wheezing any more, on Tamiflu, albuterol, atrovent and oral steroids, slowly improving  2. CAD  - s/p NSTEMI - possibly demand ischemia, new ECG changes, stress testing once acute infection has resolved, possibly outpatient  3. Acute on chronic systolic CHF - high BNP, improving with Lasix, minimal negative fluid balance, still mildly fluid overloaded, we will increase Lasix to 40 mg po bid.  Chronically low LVEF, will need ICD, for now Zoll Life vest. Frequent ectopy and runs of atrial tachycardia, most probably driven by acute illness and use of beta-agonists.  4. Hypokalemia - we will replace   LOS: 6 days    Seth Hunt, Seth Hunt, Seth Hunt 12/29/2013 11:36 AM

## 2013-12-30 LAB — CBC
HCT: 39.7 % (ref 39.0–52.0)
HEMOGLOBIN: 13.4 g/dL (ref 13.0–17.0)
MCH: 28.6 pg (ref 26.0–34.0)
MCHC: 33.8 g/dL (ref 30.0–36.0)
MCV: 84.6 fL (ref 78.0–100.0)
Platelets: 212 10*3/uL (ref 150–400)
RBC: 4.69 MIL/uL (ref 4.22–5.81)
RDW: 12.9 % (ref 11.5–15.5)
WBC: 10.7 10*3/uL — AB (ref 4.0–10.5)

## 2013-12-30 LAB — BASIC METABOLIC PANEL
BUN: 29 mg/dL — ABNORMAL HIGH (ref 6–23)
CO2: 28 meq/L (ref 19–32)
Calcium: 8.8 mg/dL (ref 8.4–10.5)
Chloride: 103 mEq/L (ref 96–112)
Creatinine, Ser: 0.93 mg/dL (ref 0.50–1.35)
GFR calc Af Amer: >90 mL/min (ref >90)
GFR calc non Af Amer: 78 mL/min — ABNORMAL LOW (ref >90)
GLUCOSE: 187 mg/dL — AB (ref 70–99)
Potassium: 3.2 mEq/L — ABNORMAL LOW (ref 3.7–5.3)
SODIUM: 144 meq/L (ref 137–147)

## 2013-12-30 LAB — GLUCOSE, CAPILLARY
GLUCOSE-CAPILLARY: 269 mg/dL — AB (ref 70–99)
GLUCOSE-CAPILLARY: 76 mg/dL (ref 70–99)
Glucose-Capillary: 209 mg/dL — ABNORMAL HIGH (ref 70–99)
Glucose-Capillary: 160 mg/dL — ABNORMAL HIGH (ref 70–99)

## 2013-12-30 MED ORDER — INSULIN ASPART 100 UNIT/ML ~~LOC~~ SOLN
4.0000 [IU] | Freq: Three times a day (TID) | SUBCUTANEOUS | Status: DC
  Administered 2013-12-30 – 2014-01-01 (×5): 4 [IU] via SUBCUTANEOUS

## 2013-12-30 MED ORDER — INSULIN DETEMIR 100 UNIT/ML ~~LOC~~ SOLN
13.0000 [IU] | Freq: Every day | SUBCUTANEOUS | Status: DC
  Administered 2013-12-31 – 2014-01-02 (×3): 13 [IU] via SUBCUTANEOUS
  Filled 2013-12-30 (×3): qty 0.13

## 2013-12-30 MED ORDER — POTASSIUM CHLORIDE 20 MEQ/15ML (10%) PO LIQD
20.0000 meq | Freq: Once | ORAL | Status: AC
  Administered 2013-12-30: 20 meq via ORAL
  Filled 2013-12-30: qty 15

## 2013-12-30 MED ORDER — INSULIN ASPART 100 UNIT/ML ~~LOC~~ SOLN: 5.0000 [IU] | Freq: Three times a day (TID) | SUBCUTANEOUS | Status: DC

## 2013-12-30 NOTE — Progress Notes (Signed)
Subjective:  Mild shortness of breath, no recurrent chest pain  Objective:  Vital Signs in the last 24 hours: BP 120/64  Pulse 92  Temp(Src) 98 F (36.7 C) (Oral)  Resp 18  Ht 5\' 8"  (1.727 m)  Wt 86.455 kg (190 lb 9.6 oz)  BMI 28.99 kg/m2  SpO2 92%  Physical Exam: Pleasant obese male coughing in no acute distress Lungs:  Mild rales bilaterally Cardiac:  Regular rhythm, normal S1 and S2, no S3 Extremities:  No edema present  Intake/Output from previous day:    Weight Filed Weights   12/23/13 1841 12/24/13 0451 12/28/13 0636  Weight: 81.647 kg (180 lb) 86.5 kg (190 lb 11.2 oz) 86.455 kg (190 lb 9.6 oz)    Lab Results: Basic Metabolic Panel:  Recent Labs  01/60/10 0447 12/30/13 0500  NA 142 144  K 3.0* 3.2*  CL 101 103  CO2 27 28  GLUCOSE 171* 187*  BUN 30* 29*  CREATININE 0.77 0.93   CBC:  Recent Labs  12/29/13 0447 12/30/13 0500  WBC 7.5 10.7*  HGB 12.9* 13.4  HCT 38.0* 39.7  MCV 83.9 84.6  PLT 191 212   Telemetry: Sinus with PVCs  Assessment/Plan:  1. Influenza A with some improvement 2. Coronary artery disease with previous non-STEMI 3. Acute systolic congestive heart failure clinically improved but the weight is unchanged 4. Hypokalemia needs replacement  Recommendations:  Continue treatment for the flu. Replace potassium. Continue current dose of furosemide.        Seth Palmer  MD [facility]  12/30/2013, 12:08 PM

## 2013-12-30 NOTE — Progress Notes (Signed)
Patient ID: Seth Hunt  male  ZOX:[id number]    DOB: 10-Sep-1933    DOA: 12/23/2013  PCP: Harlan StainsOE,[name], MD  Assessment/Plan: Principal Problem:   Acute hypoxic respiratory failure secondary to 1)  Influenza A with COPD exacerbation- Improving slowly, still on 2 L O2 via nasal cannula - Completed Tamiflu, prednisone, tapered to 30 mg - Continue Xopenex and Atrovent qid,O2 , dulera  - Will need home O2, 2 L continuous  2) acute on chronic systolic and diastolic CHF - Cardiology following, continue Lasix to 40 mg BID  - 2-D echo EF 30-35% with grade 1 diastolic dysfunction - Recommended ICD, for now life vest placed  NSTEMI/ Troponin level elevated, likely demand ischemia, EF 30-35% by echo this adm  - Cardiology following, Continue aspirin, statin, beta blocker - Recommended outpatient stress test once pulmonology issues are resolved - Per cardiology, Outpatient referral to EP for ICD implantation  Hypokalemia: Replaced    DM (diabetes mellitus) - BS somewhat elevated likely due to prednisone, increased Levemir to 13 units, sliding scale insulin  - Increased meal coverage to 4 units TID ac      HTN (hypertension): Stable      Depression- stable     Thrombocytopenia, unspecified- improving   DVT Prophylaxis:Lovenox   Code Status: full code   Family Communication: discussed in detail with the patient   Disposition: per cards, hopefully tomorrow   Subjective:  feels somewhat better, wondering if he would be going home today, life was placed  Objective: Weight change:  No intake or output data in the 24 hours ending 12/30/13 1258 Blood pressure 120/64, pulse 92, temperature 98 F (36.7 C), temperature source Oral, resp. rate 18, height 5\' 8"  (1.727 m), weight 86.455 kg (190 lb 9.6 oz), SpO2 92.00%.  Physical Exam: General: Alert and awake, oriented x3, NAD CVS: S1-S2 clear, no murmur rubs or gallops Chest: Mild bibasilar crackles, life vest placed Abdomen: soft  nontender, nondistended, normal bowel sounds  Extremities: no cyanosis, clubbing or edema noted bilaterally   Lab Results: Basic Metabolic Panel:  Recent Labs Lab 12/25/13 0330  12/29/13 0447 12/30/13 0500  NA  --   < > 142 144  K  --   < > 3.0* 3.2*  CL  --   < > 101 103  CO2  --   < > 27 28  GLUCOSE  --   < > 171* 187*  BUN  --   < > 30* 29*  CREATININE  --   < > 0.77 0.93  CALCIUM  --   < > 8.7 8.8  MG 1.9  --   --   --   < > = values in this interval not displayed. Liver Function Tests:  Recent Labs Lab 12/23/13 1914  AST 19  ALT 14  ALKPHOS 81  BILITOT 0.5  PROT 7.7  ALBUMIN 3.7   No results found for this basename: LIPASE, AMYLASE,  in the last 168 hours No results found for this basename: AMMONIA,  in the last 168 hours CBC:  Recent Labs Lab 12/23/13 1914  12/29/13 0447 12/30/13 0500  WBC 7.4  < > 7.5 10.7*  NEUTROABS 6.1  --   --   --   HGB 15.3  < > 12.9* 13.4  HCT 46.0  < > 38.0* 39.7  MCV 85.2  < > 83.9 84.6  PLT 140*  < > 191 212  < > = values in this interval not displayed. Cardiac Enzymes:  Recent Labs Lab 12/23/13 2306 12/26/13 1315  TROPONINI 0.59* <0.30   BNP: No components found with this basename: POCBNP,  CBG:  Recent Labs Lab 12/29/13 1156 12/29/13 1631 12/29/13 2123 12/30/13 0801 12/30/13 1201  GLUCAP 178* 187* 239* 209* 269*     Micro Results: No results found for this or any previous visit (from the past 240 hour(s)).  Studies/Results: Dg Chest 2 View  12/26/2013   CLINICAL DATA:  Dyspnea and cough and congestion.  EXAM: CHEST  2 VIEW  COMPARISON:  Portable chest x-rays of December 23, 2012.  FINDINGS: The lungs are mildly hyperinflated. There are coarse lung markings in the retrocardiac region on the left and to a lesser extent in the right infrahilar region which suggests atelectasis or early pneumonia. The cardiopericardial silhouette is normal in size. The pulmonary vascularity is not engorged. The mediastinum is  normal in width. There is no pleural effusion. The observed portions of the bony thorax exhibit no acute abnormalities. There are surgical skin staples over the upper abdomen.  IMPRESSION: The findings are consistent with atelectasis or early interstitial pneumonia bilaterally in the lower lobes. This is superimposed upon findings of C OPD. There is no evidence of CHF nor pleural effusion.   Electronically Signed   By: David  [place]   On: 12/26/2013 12:27   Dg Chest Port 1 View  12/23/2013   CLINICAL DATA:  Shortness of breath  EXAM: PORTABLE CHEST - 1 VIEW  COMPARISON:  None.  FINDINGS: The cardiac shadow is within normal limits. The lungs are well aerated bilaterally. No focal infiltrate or sizable effusion is seen.  IMPRESSION: No acute abnormality noted.   Electronically Signed   By: Alcide Clever M.D.   On: 12/23/2013 19:09    Medications: Scheduled Meds: . aspirin EC  81 mg Oral Daily  . atorvastatin  20 mg Oral q1800  . dextromethorphan-guaiFENesin  1 tablet Oral BID  . enoxaparin (LOVENOX) injection  40 mg Subcutaneous Daily  . FLUoxetine  20 mg Oral q morning - 10a  . furosemide  40 mg Oral BID  . glimepiride  4 mg Oral Q breakfast  . insulin aspart  0-20 Units Subcutaneous TID WC  . insulin aspart  0-5 Units Subcutaneous QHS  . insulin aspart  3 Units Subcutaneous TID WC  . insulin detemir  10 Units Subcutaneous Daily  . ipratropium  0.5 mg Nebulization QID  . levalbuterol  0.63 mg Nebulization QID  . metoprolol tartrate  50 mg Oral BID  . mometasone-formoterol  2 puff Inhalation BID  . potassium chloride  20 mEq Oral Once  . potassium chloride  40 mEq Oral BID  . predniSONE  30 mg Oral Q breakfast  . ramipril  10 mg Oral Daily  . sodium chloride  3 mL Intravenous Q12H  . tamsulosin  0.4 mg Oral q morning - 10a      LOS: 7 days   RAI,RIPUDEEP M.D. [facility] 12/30/2013, 12:58 PM Pager: [phone]  If 7PM-7AM, please contact night-coverage [email or web address] Password  [email or web address]

## 2013-12-31 ENCOUNTER — Inpatient Hospital Stay (HOSPITAL_COMMUNITY): Payer: Medicare Other

## 2013-12-31 LAB — PRO B NATRIURETIC PEPTIDE: Pro B Natriuretic peptide (BNP): 1561 pg/mL — ABNORMAL HIGH (ref 0–450)

## 2013-12-31 LAB — GLUCOSE, CAPILLARY
GLUCOSE-CAPILLARY: 82 mg/dL (ref 70–99)
GLUCOSE-CAPILLARY: 126 mg/dL — AB (ref 70–99)
GLUCOSE-CAPILLARY: 188 mg/dL — AB (ref 70–99)
Glucose-Capillary: 156 mg/dL — ABNORMAL HIGH (ref 70–99)
Glucose-Capillary: 75 mg/dL (ref 70–99)

## 2013-12-31 LAB — BASIC METABOLIC PANEL
BUN: 25 mg/dL — ABNORMAL HIGH (ref 6–23)
CO2: 27 mEq/L (ref 19–32)
Calcium: 8.7 mg/dL (ref 8.4–10.5)
Chloride: 102 mEq/L (ref 96–112)
Creatinine, Ser: 0.9 mg/dL (ref 0.50–1.35)
GFR, EST NON AFRICAN AMERICAN: 79 mL/min — AB (ref >90)
GLUCOSE: 111 mg/dL — AB (ref 70–99)
POTASSIUM: 3 meq/L — AB (ref 3.7–5.3)
Sodium: 143 mEq/L (ref 137–147)

## 2013-12-31 LAB — MAGNESIUM: MAGNESIUM: 2 mg/dL (ref 1.5–2.5)

## 2013-12-31 MED ORDER — LEVALBUTEROL HCL 0.63 MG/3ML IN NEBU: 0.6300 mg | INHALATION_SOLUTION | Freq: Three times a day (TID) | RESPIRATORY_TRACT | Status: DC

## 2013-12-31 MED ORDER — FUROSEMIDE 40 MG PO TABS: 40.0000 mg | ORAL_TABLET | Freq: Two times a day (BID) | ORAL | Status: DC

## 2013-12-31 MED ORDER — SIMVASTATIN 20 MG PO TABS: 20.0000 mg | ORAL_TABLET | Freq: Every day | ORAL | Status: DC

## 2013-12-31 MED ORDER — POTASSIUM CHLORIDE CRYS ER 20 MEQ PO TBCR: 40.0000 meq | EXTENDED_RELEASE_TABLET | Freq: Two times a day (BID) | ORAL | Status: DC

## 2013-12-31 MED ORDER — POTASSIUM CHLORIDE CRYS ER 20 MEQ PO TBCR
40.0000 meq | EXTENDED_RELEASE_TABLET | Freq: Once | ORAL | Status: AC
  Administered 2013-12-31: 40 meq via ORAL

## 2013-12-31 MED ORDER — DM-GUAIFENESIN ER 30-600 MG PO TB12: 1.0000 | ORAL_TABLET | Freq: Two times a day (BID) | ORAL | Status: DC

## 2013-12-31 MED ORDER — METOPROLOL TARTRATE 50 MG PO TABS: 50.0000 mg | ORAL_TABLET | Freq: Two times a day (BID) | ORAL | Status: DC

## 2013-12-31 MED ORDER — MOMETASONE FURO-FORMOTEROL FUM 100-5 MCG/ACT IN AERO: 2.0000 | INHALATION_SPRAY | Freq: Two times a day (BID) | RESPIRATORY_TRACT | Status: DC

## 2013-12-31 MED ORDER — POTASSIUM CHLORIDE CRYS ER 20 MEQ PO TBCR
40.0000 meq | EXTENDED_RELEASE_TABLET | Freq: Three times a day (TID) | ORAL | Status: AC
  Administered 2013-12-31 – 2014-01-02 (×6): 40 meq via ORAL
  Filled 2013-12-31 (×7): qty 2

## 2013-12-31 MED ORDER — PREDNISONE 10 MG PO TABS: ORAL_TABLET | ORAL | Status: DC

## 2013-12-31 MED ORDER — TIOTROPIUM BROMIDE MONOHYDRATE 18 MCG IN CAPS: 18.0000 ug | ORAL_CAPSULE | Freq: Every day | RESPIRATORY_TRACT | Status: DC

## 2013-12-31 NOTE — Progress Notes (Signed)
0930- pt with cough spells which make him SOB SAO2 check and was 89% pt restarted on O2 at 1L SAO2 up to 90-93%. Will continue to monitor pt continues to expectorate mod amts. Yellowish thick mucous.1230.- pt to [facility] with report of watery diarrhea yellowish brown. Informed to advise nurse is further episodes noted.1530 TC to Amanda-stepdaughter at [phone] to inform her pt will not be discharged today, anticipate d/c tomorrow. 1600-TC from [facility] requesting TC from nurse or pt when pt is leaving hospital tomorrow to advise when they can set up home O2. 1800 - pt denies any further diarrhea today

## 2013-12-31 NOTE — Progress Notes (Signed)
Patient ID: Seth Hunt  male  FAO:[id number]    DOB: 05-04-1933    DOA: 12/23/2013  PCP: Harlan StainsOE,[name], MD  Assessment/Plan: Principal Problem:   Acute hypoxic respiratory failure secondary to 1)  Influenza A with COPD exacerbation- Improving slowly, will need home O2 at DC - Completed Tamiflu, prednisone, tapered to 30 mg - Continue Xopenex and Atrovent qid,O2 , dulera  - Will need home O2, 2 L continuous - Will check chest x-ray for comparison  2) acute on chronic systolic and diastolic CHF - Cardiology following, continue Lasix to 40 mg BID  - 2-D echo EF 30-35% with grade 1 diastolic dysfunction - Recommended ICD, for now life vest placed - Check a BNP  NSTEMI/ Troponin level elevated, likely demand ischemia, EF 30-35% by echo this adm  - Cardiology following, Continue aspirin, statin, beta blocker - Recommended outpatient stress test once pulmonology issues are resolved - Per cardiology, Outpatient referral to EP for ICD implantation  Hypokalemia: Replaced    DM (diabetes mellitus) - Continue Levemir 13 units, meal coverage, sliding scale insulin        HTN (hypertension): Stable      Depression- stable     Thrombocytopenia, unspecified- improving   DVT Prophylaxis:Lovenox   Code Status: full code   Family Communication: discussed in detail with the patient   Disposition: When cleared by cardiology   Subjective:  feels somewhat better, wondering if he would be going home today, life was placed  Objective: Weight change:   Intake/Output Summary (Last 24 hours) at 12/31/13 1223 Last data filed at 12/30/13 1310  Gross per 24 hour  Intake      3 ml  Output      0 ml  Net      3 ml   Blood pressure 113/76, pulse 102, temperature 98.7 F (37.1 C), temperature source Oral, resp. rate 18, height 5\' 8"  (1.727 m), weight 86.455 kg (190 lb 9.6 oz), SpO2 92.00%.  Physical Exam: General: Alert and awake, oriented x3, NAD CVS: S1-S2 clear Chest: Mild bibasilar  crackles, life vest placed Abdomen: soft NT, ND, NBS Extremities: no cyanosis, clubbing or edema noted bilaterally   Lab Results: Basic Metabolic Panel:  Recent Labs Lab 12/30/13 0500 12/31/13 0533  NA 144 143  K 3.2* 3.0*  CL 103 102  CO2 28 27  GLUCOSE 187* 111*  BUN 29* 25*  CREATININE 0.93 0.90  CALCIUM 8.8 8.7  MG  --  2.0   Liver Function Tests: No results found for this basename: AST, ALT, ALKPHOS, BILITOT, PROT, ALBUMIN,  in the last 168 hours No results found for this basename: LIPASE, AMYLASE,  in the last 168 hours No results found for this basename: AMMONIA,  in the last 168 hours CBC:  Recent Labs Lab 12/29/13 0447 12/30/13 0500  WBC 7.5 10.7*  HGB 12.9* 13.4  HCT 38.0* 39.7  MCV 83.9 84.6  PLT 191 212   Cardiac Enzymes:  Recent Labs Lab 12/26/13 1315  TROPONINI <0.30   BNP: No components found with this basename: POCBNP,  CBG:  Recent Labs Lab 12/30/13 1713 12/30/13 2131 12/30/13 2201 12/31/13 0755 12/31/13 1201  GLUCAP 160* 76 82 126* 156*     Micro Results: No results found for this or any previous visit (from the past 240 hour(s)).  Studies/Results: Dg Chest 2 View  12/26/2013   CLINICAL DATA:  Dyspnea and cough and congestion.  EXAM: CHEST  2 VIEW  COMPARISON:  Portable chest x-rays of  December 23, 2012.  FINDINGS: The lungs are mildly hyperinflated. There are coarse lung markings in the retrocardiac region on the left and to a lesser extent in the right infrahilar region which suggests atelectasis or early pneumonia. The cardiopericardial silhouette is normal in size. The pulmonary vascularity is not engorged. The mediastinum is normal in width. There is no pleural effusion. The observed portions of the bony thorax exhibit no acute abnormalities. There are surgical skin staples over the upper abdomen.  IMPRESSION: The findings are consistent with atelectasis or early interstitial pneumonia bilaterally in the lower lobes. This is  superimposed upon findings of C OPD. There is no evidence of CHF nor pleural effusion.   Electronically Signed   By: David  [place]   On: 12/26/2013 12:27   Dg Chest Port 1 View  12/23/2013   CLINICAL DATA:  Shortness of breath  EXAM: PORTABLE CHEST - 1 VIEW  COMPARISON:  None.  FINDINGS: The cardiac shadow is within normal limits. The lungs are well aerated bilaterally. No focal infiltrate or sizable effusion is seen.  IMPRESSION: No acute abnormality noted.   Electronically Signed   By: Alcide Clever M.D.   On: 12/23/2013 19:09    Medications: Scheduled Meds: . aspirin EC  81 mg Oral Daily  . atorvastatin  20 mg Oral q1800  . dextromethorphan-guaiFENesin  1 tablet Oral BID  . enoxaparin (LOVENOX) injection  40 mg Subcutaneous Daily  . FLUoxetine  20 mg Oral q morning - 10a  . furosemide  40 mg Oral BID  . glimepiride  4 mg Oral Q breakfast  . insulin aspart  0-20 Units Subcutaneous TID WC  . insulin aspart  0-5 Units Subcutaneous QHS  . insulin aspart  4 Units Subcutaneous TID WC  . insulin detemir  13 Units Subcutaneous Daily  . ipratropium  0.5 mg Nebulization QID  . levalbuterol  0.63 mg Nebulization QID  . metoprolol tartrate  50 mg Oral BID  . mometasone-formoterol  2 puff Inhalation BID  . potassium chloride  40 mEq Oral TID WC  . predniSONE  30 mg Oral Q breakfast  . ramipril  10 mg Oral Daily  . sodium chloride  3 mL Intravenous Q12H  . tamsulosin  0.4 mg Oral q morning - 10a      LOS: 8 days   Ashara Lounsbury M.D. [facility] 12/31/2013, 12:23 PM Pager: [phone]  If 7PM-7AM, please contact night-coverage [email or web address] Password [email or web address]

## 2013-12-31 NOTE — Progress Notes (Addendum)
   CARE MANAGEMENT NOTE 12/31/2013  Patient:  Seth Hunt, Seth Hunt   Account Number:  [id number]  Date Initiated:  12/25/2013  Documentation initiated by:  GRAVES-BIGELOW,BRENDA  Subjective/Objective Assessment:   Pt admitted for COPD exacerbation with acute respiratory failure (presenting O2 sat 87%).     Action/Plan:   CM to assess for 02 need for home.   Anticipated DC Date:  12/27/2013   Anticipated DC Plan:  HOME W HOME HEALTH SERVICES      DC Planning Services  CM consult      [facility] Choice  DURABLE MEDICAL EQUIPMENT   Choice offered to / List presented to:  C-1 Patient   DME arranged  OTHER - SEE COMMENT      DME agency  OTHER - SEE NOTE        Status of service:  Completed, signed off Medicare Important Message given?   (If response is "NO", the following Medicare IM given date fields will be blank) Date Medicare IM given:   Date Additional Medicare IM given:    Discharge Disposition:  HOME/SELF CARE  Per UR Regulation:  Reviewed for med. necessity/level of care/duration of stay  If discussed at Long Length of Stay Meetings, dates discussed:   12/28/2013    Comments:  12/31/13 RN called to request DME.  DME called by CM for 02 and neb machine.  DME will be delivered to room prior to discharge. Life vest placed yesterday, 12/30/13.  No other CM needs were communicated.  Freddy Jaksch, BSN, [place] [phone].  12-29-13 [address], RN,BSN [phone] life vest via zoll. Pt to be fit 12-29-13.

## 2013-12-31 NOTE — Progress Notes (Signed)
   CARE MANAGEMENT NOTE 12/31/2013  Patient:  Seth Hunt, Seth Hunt   Account Number:  [id number]  Date Initiated:  12/25/2013  Documentation initiated by:  GRAVES-BIGELOW,BRENDA  Subjective/Objective Assessment:   Pt admitted for COPD exacerbation with acute respiratory failure (presenting O2 sat 87%).     Action/Plan:   CM to assess for 02 need for home.   Anticipated DC Date:  12/27/2013   Anticipated DC Plan:  HOME W HOME HEALTH SERVICES      DC Planning Services  CM consult      [facility] Choice  DURABLE MEDICAL EQUIPMENT   Choice offered to / List presented to:  C-1 Patient   DME arranged  OTHER - SEE COMMENT      DME agency  OTHER - SEE NOTE     HH arranged  HH-1 RN      [facility] agency  [facility]   Status of service:  Completed, signed off Medicare Important Message given?   (If response is "NO", the following Medicare IM given date fields will be blank) Date Medicare IM given:   Date Additional Medicare IM given:    Discharge Disposition:  HOME W HOME HEALTH SERVICES  Per UR Regulation:  Reviewed for med. necessity/level of care/duration of stay  If discussed at Long Length of Stay Meetings, dates discussed:   12/28/2013    Comments:  12/31/09:48 CM spoke with pt in room for choice.  Pt chooses [facility] to render [facility] services.  Address and contact numbers verified with pt.  Referral faxed to [facility] for [facility].  No other CM needs were communicated.  Freddy Jaksch, BSN, [place] [phone].   12/31/13 RN called to request DME.  DME called by CM for 02 and neb machine.  DME will be delivered to room prior to discharge. Life vest placed yesterday, 12/30/13.   No other CM needs were communicated.  Freddy Jaksch, BSN, [place] [phone].  12-29-13 [address] RN,BSN [phone] life vest via zoll. Pt to be fit 12-29-13.

## 2013-12-31 NOTE — Progress Notes (Signed)
Subjective:  He is still coughing significantly with purulent sputum. No significant chest pain.  Objective:  Vital Signs in the last 24 hours: BP 113/76  Pulse 102  Temp(Src) 98.7 F (37.1 C) (Oral)  Resp 18  Ht 5\' 8"  (1.727 m)  Wt 86.455 kg (190 lb 9.6 oz)  BMI 28.99 kg/m2  SpO2 92%  Physical Exam: Pleasant obese male coughing in no acute distress Lungs:  Mild rales bilaterally Cardiac:  Regular rhythm, normal S1 and S2, no S3 Extremities:  No edema present  Intake/Output from previous day: 01/17 0701 - 01/18 0700 In: 3 [I.V.:3] Out: -   Weight Filed Weights   12/23/13 1841 12/24/13 0451 12/28/13 0636  Weight: 81.647 kg (180 lb) 86.5 kg (190 lb 11.2 oz) 86.455 kg (190 lb 9.6 oz)    Lab Results: Basic Metabolic Panel:  Recent Labs  50/09/38 0500 12/31/13 0533  NA 144 143  K 3.2* 3.0*  CL 103 102  CO2 28 27  GLUCOSE 187* 111*  BUN 29* 25*  CREATININE 0.93 0.90   CBC:  Recent Labs  12/29/13 0447 12/30/13 0500  WBC 7.5 10.7*  HGB 12.9* 13.4  HCT 38.0* 39.7  MCV 83.9 84.6  PLT 191 212   Telemetry: Sinus with PVCs  Assessment/Plan:  1. Influenza A with some improvement 2. Coronary artery disease with previous non-STEMI 3. Acute systolic congestive heart failure clinically improved but the weight is unchanged 4. Hypokalemia needs replacement this will need to be increased.  Recommendations:  Continue treatment for the flu. Replace potassium. Continue current dose of furosemide.        Seth Palmer  MD [facility]  12/31/2013, 11:42 AM

## 2014-01-01 DIAGNOSIS — R197 Diarrhea, unspecified: Secondary | ICD-10-CM | POA: Diagnosis present

## 2014-01-01 LAB — GLUCOSE, CAPILLARY
GLUCOSE-CAPILLARY: 163 mg/dL — AB (ref 70–99)
GLUCOSE-CAPILLARY: 159 mg/dL — AB (ref 70–99)
GLUCOSE-CAPILLARY: 84 mg/dL (ref 70–99)
Glucose-Capillary: 243 mg/dL — ABNORMAL HIGH (ref 70–99)

## 2014-01-01 LAB — BASIC METABOLIC PANEL
BUN: 27 mg/dL — AB (ref 6–23)
CHLORIDE: 104 meq/L (ref 96–112)
CO2: 19 meq/L (ref 19–32)
CREATININE: 0.84 mg/dL (ref 0.50–1.35)
Calcium: 8.9 mg/dL (ref 8.4–10.5)
GFR calc Af Amer: >90 mL/min (ref >90)
GFR calc non Af Amer: 82 mL/min — ABNORMAL LOW (ref >90)
GLUCOSE: 147 mg/dL — AB (ref 70–99)
Potassium: 3.6 mEq/L — ABNORMAL LOW (ref 3.7–5.3)
Sodium: 142 mEq/L (ref 137–147)

## 2014-01-01 LAB — CLOSTRIDIUM DIFFICILE BY PCR: Toxigenic C. Difficile by PCR: NEGATIVE

## 2014-01-01 MED ORDER — METOPROLOL TARTRATE 25 MG PO TABS
25.0000 mg | ORAL_TABLET | ORAL | Status: AC
  Administered 2014-01-01: 25 mg via ORAL
  Filled 2014-01-01: qty 1

## 2014-01-01 MED ORDER — PREDNISONE 20 MG PO TABS
20.0000 mg | ORAL_TABLET | Freq: Every day | ORAL | Status: DC
  Administered 2014-01-02: 20 mg via ORAL
  Filled 2014-01-01 (×2): qty 1

## 2014-01-01 MED ORDER — METOPROLOL TARTRATE 50 MG PO TABS
75.0000 mg | ORAL_TABLET | Freq: Two times a day (BID) | ORAL | Status: DC
  Administered 2014-01-01 – 2014-01-02 (×2): 75 mg via ORAL
  Filled 2014-01-01 (×3): qty 1

## 2014-01-01 NOTE — Progress Notes (Signed)
Physical Therapy Treatment Patient Details Name: Seth BussingWesley Hunt MRN: [id number] DOB: [birth year] Today's Date: 01/01/2014 Time: [id number] PT Time Calculation (min): 19 min  PT Assessment / Plan / Recommendation  History of Present Illness 80 y.o. [facility] with DM, HTN, HPL, COPD presented with SOB, non productive cough, associated with DOE, chills after recent URI symptoms; patient found to have mild hypoxia in ED which improved after inhalers+oxygen.  (+) for Influenza A, put on droplet precautions.     PT Comments   Pt is progressing slowly with his gait and endurance.  He is a little more staggering on his feet today requiring min hand held assist to walk safely.  DOE is 2/4 and despite DOE levels his O2 sats on RA throughout mobility and exercises is 96-98%, so pt left on RA after session.  RN made aware.  Changed d/c recs to HHPT at discharge due to generalized weakness, decreased endurance and gait instability.    Follow Up Recommendations  Home health PT;Supervision - Intermittent     Does the patient have the potential to tolerate intense rehabilitation    NA  Barriers to Discharge   None      Equipment Recommendations  None recommended by PT    Recommendations for Other Services   None  Frequency Min 3X/week   Progress towards PT Goals Progress towards PT goals: Progressing toward goals  Plan Discharge plan needs to be updated    Precautions / Restrictions Precautions Precautions: Fall;Other (comment) (on both droplet and c-diff precautions. ) Precaution Comments: monitor O2 sats.  Pt did not use O2 PTA   Pertinent Vitals/Pain O2 sats throughout session on RA 96-98%.  DOE 2/4, HR in the 70s.  Productive cough with copious mucus throughout session.     Mobility  Transfers Overall transfer level: Modified independent General transfer comment: uses upper extremities for transitions Ambulation/Gait Ambulation/Gait assistance: Min assist Ambulation  Distance (Feet): 150 Feet Assistive device: None Gait Pattern/deviations: Step-through pattern;Staggering left;Staggering right Gait velocity: decreased Gait velocity interpretation: Below normal speed for age/gender General Gait Details: 2 standing rest breaks due to DOE during gait.  O2 sats remained 96-98% entire session on RA despite DOE 2/4 during gait.     Exercises General Exercises - Upper Extremity Shoulder Flexion: AROM;Both;10 reps;Seated Elbow Flexion: AROM;Both;10 reps;Seated General Exercises - Lower Extremity Long Arc Quad: AROM;Both;10 reps;Seated Hip Flexion/Marching: AROM;Both;10 reps;Seated Toe Raises: AROM;Both;10 reps;Seated Heel Raises: AROM;Both;10 reps;Seated    PT Goals (current goals can now be found in the care plan section) Acute Rehab PT Goals Patient Stated Goal: To feel better  Visit Information  Last PT Received On: 01/01/14 Assistance Needed: +1 History of Present Illness: 80 [facility]. [facility] with DM, HTN, HPL, COPD presented with SOB, non productive cough, associated with DOE, chills after recent URI symptoms; patient found to have mild hypoxia in ED which improved after inhalers+oxygen.  (+) for Influenza A, put on droplet precautions.      Subjective Data  Subjective: Pt reports he did not feel so well this AM.  Continuing to have loose stools.  Patient Stated Goal: To feel better   Cognition  Cognition Arousal/Alertness: Awake/alert Behavior During Therapy: WFL for tasks assessed/performed Overall Cognitive Status: Within Functional Limits for tasks assessed    Balance  Balance Overall balance assessment: Needs assistance Standing balance support: Single extremity supported Standing balance-Leahy Scale: Good  End of Session PT - End of Session Equipment Utilized During  Treatment: Gait belt Activity Tolerance: Patient limited by fatigue Patient left: in chair;with call bell/phone within reach Nurse Communication:  Mobility status;Other (comment) (O2 sats- left pt on RA due to sats are high 90s.  )    Lurena Joiner B. Rudy Domek, PT, DPT [phone]   01/01/2014, 5:04 PM

## 2014-01-01 NOTE — Progress Notes (Signed)
Patient ID: Seth Hunt  male  WGN:[id number]    DOB: [birth year]    DOA: 12/23/2013  PCP: Harlan StainsOE,[name], MD  Assessment/Plan: Principal Problem:   Acute hypoxic respiratory failure secondary to 1)  Influenza A with COPD exacerbation- Improving slowly, will need home O2 at DC, coughing a lot  - Completed Tamiflu, prednisone, tapered to 20 mg tomorrow - Continue Xopenex and Atrovent qid,O2 , dulera  - Will need home O2, 2 L continuous - Chest x-ray done 1/18 showed no evidence of pneumonia or CHF, underlying COPD  2) acute on chronic systolic and diastolic CHF - Cardiology following, continue Lasix 40 mg BID  - 2-D echo EF 30-35% with grade 1 diastolic dysfunction - Recommended ICD, for now life vest placed - BNP still 1561, improving, negative 838cc   Diarrhea:- Since last night   - check CDiff PCR - Not on any antibiotics at this time  NSTEMI/ Troponin level elevated, likely demand ischemia, EF 30-35% by echo this adm  - Cardiology following, Continue aspirin, statin, beta blocker - Recommended outpatient stress test once pulmonology issues are resolved - Per cardiology, referral to EP for ICD implantation  Hypokalemia: Replaced    DM (diabetes mellitus) - fairly controlled, continue Levemir 13 units, meal coverage, sliding scale insulin       HTN (hypertension): Stable      Depression- stable     Thrombocytopenia, unspecified- improving   DVT Prophylaxis:Lovenox   Code Status: full code   Family Communication: discussed in detail with the patient   Disposition: When cleared by cardiology, cdiff pending   Subjective: Having diarrhea since yesterday, 2 bowel movements this morning, did not tell his RN, watery, coughing+   Objective: Weight change:   Intake/Output Summary (Last 24 hours) at 01/01/14 1112 Last data filed at 12/31/13 1809  Gross per 24 hour  Intake    240 ml  Output      0 ml  Net    240 ml   Blood pressure 110/48, pulse 86, temperature 97.7 F  (36.5 C), temperature source Oral, resp. rate 18, height 5\' 8"  (1.727 m), weight 86.455 kg (190 lb 9.6 oz), SpO2 93.00%.  Physical Exam: General: A x O x3, NAD CVS: S1-S2 clear Chest: Mild bibasilar crackles, life vest placed Abdomen: soft NT, ND, NBS Extremities: no c/c trace edema noted bilaterally   Lab Results: Basic Metabolic Panel:  Recent Labs Lab 12/31/13 0533 01/01/14 0443  NA 143 142  K 3.0* 3.6*  CL 102 104  CO2 27 19  GLUCOSE 111* 147*  BUN 25* 27*  CREATININE 0.90 0.84  CALCIUM 8.7 8.9  MG 2.0  --    Liver Function Tests: No results found for this basename: AST, ALT, ALKPHOS, BILITOT, PROT, ALBUMIN,  in the last 168 hours No results found for this basename: LIPASE, AMYLASE,  in the last 168 hours No results found for this basename: AMMONIA,  in the last 168 hours CBC:  Recent Labs Lab 12/29/13 0447 12/30/13 0500  WBC 7.5 10.7*  HGB 12.9* 13.4  HCT 38.0* 39.7  MCV 83.9 84.6  PLT 191 212   Cardiac Enzymes:  Recent Labs Lab 12/26/13 1315  TROPONINI <0.30   BNP: No components found with this basename: POCBNP,  CBG:  Recent Labs Lab 12/30/13 2201 12/31/13 0755 12/31/13 1201 12/31/13 1646 12/31/13 2117  GLUCAP 82 126* 156* 75 188*     Micro Results: No results found for this or any previous visit (from the past 240  hour(s)).  Studies/Results: Dg Chest 2 View  12/26/2013   CLINICAL DATA:  Dyspnea and cough and congestion.  EXAM: CHEST  2 VIEW  COMPARISON:  Portable chest x-rays of December 23, 2012.  FINDINGS: The lungs are mildly hyperinflated. There are coarse lung markings in the retrocardiac region on the left and to a lesser extent in the right infrahilar region which suggests atelectasis or early pneumonia. The cardiopericardial silhouette is normal in size. The pulmonary vascularity is not engorged. The mediastinum is normal in width. There is no pleural effusion. The observed portions of the bony thorax exhibit no acute  abnormalities. There are surgical skin staples over the upper abdomen.  IMPRESSION: The findings are consistent with atelectasis or early interstitial pneumonia bilaterally in the lower lobes. This is superimposed upon findings of C OPD. There is no evidence of CHF nor pleural effusion.   Electronically Signed   By: David  [place]   On: 12/26/2013 12:27   Dg Chest Port 1 View  12/23/2013   CLINICAL DATA:  Shortness of breath  EXAM: PORTABLE CHEST - 1 VIEW  COMPARISON:  None.  FINDINGS: The cardiac shadow is within normal limits. The lungs are well aerated bilaterally. No focal infiltrate or sizable effusion is seen.  IMPRESSION: No acute abnormality noted.   Electronically Signed   By: Alcide Clever M.D.   On: 12/23/2013 19:09    Medications: Scheduled Meds: . aspirin EC  81 mg Oral Daily  . atorvastatin  20 mg Oral q1800  . dextromethorphan-guaiFENesin  1 tablet Oral BID  . enoxaparin (LOVENOX) injection  40 mg Subcutaneous Daily  . FLUoxetine  20 mg Oral q morning - 10a  . furosemide  40 mg Oral BID  . glimepiride  4 mg Oral Q breakfast  . insulin aspart  0-20 Units Subcutaneous TID WC  . insulin aspart  0-5 Units Subcutaneous QHS  . insulin aspart  4 Units Subcutaneous TID WC  . insulin detemir  13 Units Subcutaneous Daily  . ipratropium  0.5 mg Nebulization QID  . levalbuterol  0.63 mg Nebulization QID  . metoprolol tartrate  50 mg Oral BID  . mometasone-formoterol  2 puff Inhalation BID  . potassium chloride  40 mEq Oral TID WC  . predniSONE  30 mg Oral Q breakfast  . ramipril  10 mg Oral Daily  . sodium chloride  3 mL Intravenous Q12H  . tamsulosin  0.4 mg Oral q morning - 10a      LOS: 9 days   Aleksandar Duve M.D. [facility] 01/01/2014, 11:12 AM Pager: [phone]  If 7PM-7AM, please contact night-coverage [email or web address] Password [email or web address]

## 2014-01-01 NOTE — Progress Notes (Signed)
Patient: Seth BussingWesley Longsworth / Admit Date: 12/23/2013 / Date of Encounter: 01/01/2014, 9:11 AM   Subjective  C/o diarrhea this AM, watery, nonbloody. 2 stools this AM so far. No CP. Dyspnea OK at rest but worsens with ambulation.  Objective    Telemetry: NSR frequent ectopy/bigeminy/NSVT, possible nonsustained atrial tach  Physical Exam: Blood pressure 110/48, pulse 86, temperature 97.7 F (36.5 C), temperature source Oral, resp. rate 18, height 5\' 8"  (1.727 m), weight 190 lb 9.6 oz (86.455 kg), SpO2 93.00%. General: Well developed, well nourished, in no acute distress. Head: Normocephalic, atraumatic, sclera non-icteric, no xanthomas, nares are without discharge. Neck: Negative for carotid bruits. JVP mildly elevated. Lungs: diminished BS throughout, mild basilar rales. No wheezes or rhonchi. Breathing is unlabored. Heart: Regular but frequent ectopy. S1 S2 without murmurs, rubs, or gallops.  Abdomen: Soft, non-tender, non-distended with normoactive bowel sounds. No rebound/guarding. Extremities: No clubbing or cyanosis. Tr edema. Distal pedal pulses are 2+ and equal bilaterally. Neuro: Alert and oriented X 3. Moves all extremities spontaneously. Psych:  Responds to questions appropriately with a normal affect.   Intake/Output Summary (Last 24 hours) at 01/01/14 0911 Last data filed at 12/31/13 1809  Gross per 24 hour  Intake    240 ml  Output      0 ml  Net    240 ml    Inpatient Medications:  . aspirin EC  81 mg Oral Daily  . atorvastatin  20 mg Oral q1800  . dextromethorphan-guaiFENesin  1 tablet Oral BID  . enoxaparin (LOVENOX) injection  40 mg Subcutaneous Daily  . FLUoxetine  20 mg Oral q morning - 10a  . furosemide  40 mg Oral BID  . glimepiride  4 mg Oral Q breakfast  . insulin aspart  0-20 Units Subcutaneous TID WC  . insulin aspart  0-5 Units Subcutaneous QHS  . insulin aspart  4 Units Subcutaneous TID WC  . insulin detemir  13 Units Subcutaneous Daily  . ipratropium   0.5 mg Nebulization QID  . levalbuterol  0.63 mg Nebulization QID  . metoprolol tartrate  50 mg Oral BID  . mometasone-formoterol  2 puff Inhalation BID  . potassium chloride  40 mEq Oral TID WC  . predniSONE  30 mg Oral Q breakfast  . ramipril  10 mg Oral Daily  . sodium chloride  3 mL Intravenous Q12H  . tamsulosin  0.4 mg Oral q morning - 10a   Infusions:    Labs:  Recent Labs  12/31/13 0533 01/01/14 0443  NA 143 142  K 3.0* 3.6*  CL 102 104  CO2 27 19  GLUCOSE 111* 147*  BUN 25* 27*  CREATININE 0.90 0.84  CALCIUM 8.7 8.9  MG 2.0  --     Recent Labs  12/30/13 0500  WBC 10.7*  HGB 13.4  HCT 39.7  MCV 84.6  PLT 212     Radiology/Studies:  Dg Chest 2 View  12/31/2013   CLINICAL DATA:  Cough and congestion and flu-like symptoms.  EXAM: CHEST  2 VIEW  COMPARISON:  PA and lateral chest x-ray dated December 26, 2013.  FINDINGS: The lungs are adequately inflated. There is no focal infiltrate. There remain coarse lung markings over the lower thoracic spine on the lateral film. The cardiac silhouette is normal in size. The pulmonary vascularity is not engorged. The frontal film correction both the frontal and lateral films are limited due to overlying electronic devices. The observed portions of the bony thorax exhibit no acute  abnormalities.  IMPRESSION: There is no evidence of pneumonia nor CHF. There is likely underlying COPD.   Electronically Signed   By: David  [place]   On: 12/31/2013 17:16   Dg Chest 2 View  12/26/2013   CLINICAL DATA:  Dyspnea and cough and congestion.  EXAM: CHEST  2 VIEW  COMPARISON:  Portable chest x-rays of December 23, 2012.  FINDINGS: The lungs are mildly hyperinflated. There are coarse lung markings in the retrocardiac region on the left and to a lesser extent in the right infrahilar region which suggests atelectasis or early pneumonia. The cardiopericardial silhouette is normal in size. The pulmonary vascularity is not engorged. The mediastinum is  normal in width. There is no pleural effusion. The observed portions of the bony thorax exhibit no acute abnormalities. There are surgical skin staples over the upper abdomen.  IMPRESSION: The findings are consistent with atelectasis or early interstitial pneumonia bilaterally in the lower lobes. This is superimposed upon findings of C OPD. There is no evidence of CHF nor pleural effusion.   Electronically Signed   By: David  [place]   On: 12/26/2013 12:27   Dg Chest Port 1 View  12/23/2013   CLINICAL DATA:  Shortness of breath  EXAM: PORTABLE CHEST - 1 VIEW  COMPARISON:  None.  FINDINGS: The cardiac shadow is within normal limits. The lungs are well aerated bilaterally. No focal infiltrate or sizable effusion is seen.  IMPRESSION: No acute abnormality noted.   Electronically Signed   By: Alcide Clever M.D.   On: 12/23/2013 19:09     Assessment and Plan  1. COPD exacerbation with acute respiratory failure (presenting O2 sat 87%) - improving. May need home O2. 2. Influenza A - tx with Tamiflu.  3. 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 MD to decide definitively regarding possible stress test as outpatient.  4. Nonischemic cardiomyopathy - etiology unclear. EF 30-35% by echo this adm (40% by cath 04/2013). Has been on ACEI/BB at home. Continue Lasix. Potassium is being repleted. Favor continuing metoprolol over coreg given its selectivity with his emphysema.  5. Frequent PVCs/NSVT - lytes OK. I am concerned frequent ectopy may contributing to worsening ectopy. PVCs noted in procedure note from stress echo 04/2013. [employer] lifevest appears to have been arranged. Consider formal consult of EP today for decreased EF, NSVT, and frequent multifocal PVCs.   6. Paroxysmal atrial tachycardia - may be due to beta agonist and acute illness. Beta blocker titrated. Potassium being repleted. 7.  Anemia/thrombocytopenia - resolved. 8. Diarrhea - C diff pending.  Signed, Ronie Spies PA-C  Patient personally seen and reviewed. Agree with above. LifeVest. Frequent ectopy/NSVT.  I'm comfortable with him having EP evaluation in outpatient setting. Continue to aggressively replete potassium.   Recent cardiac catheterization did not demonstrate any flow limiting disease. 50% LAD, mild RCA disease. I do not strongly believe that a repeat catheterization or stress test is necessary. Mildly elevated troponin in the setting of the lens/COPD. He is not having any anginal discomfort.  Continue with beta blocker, metoprolol to help suppress ectopy and we will increase to 75 mg twice a day. Obviously, if lung condition/wheezing/reactive airways worsen, we may need to rethink. I am also hesitant to start amiodarone given his underlying lung condition. Bisoprolol may be an option.

## 2014-01-02 DIAGNOSIS — R197 Diarrhea, unspecified: Secondary | ICD-10-CM

## 2014-01-02 LAB — BASIC METABOLIC PANEL
BUN: 26 mg/dL — ABNORMAL HIGH (ref 6–23)
CHLORIDE: 106 meq/L (ref 96–112)
CO2: 26 mEq/L (ref 19–32)
Calcium: 8.9 mg/dL (ref 8.4–10.5)
Creatinine, Ser: 1.03 mg/dL (ref 0.50–1.35)
GFR calc Af Amer: 78 mL/min — ABNORMAL LOW (ref >90)
GFR, EST NON AFRICAN AMERICAN: 67 mL/min — AB (ref >90)
Glucose, Bld: 82 mg/dL (ref 70–99)
POTASSIUM: 3.6 meq/L — AB (ref 3.7–5.3)
Sodium: 144 mEq/L (ref 137–147)

## 2014-01-02 LAB — GLUCOSE, CAPILLARY
Glucose-Capillary: 88 mg/dL (ref 70–99)
Glucose-Capillary: 301 mg/dL — ABNORMAL HIGH (ref 70–99)

## 2014-01-02 LAB — CBC
HEMATOCRIT: 40.6 % (ref 39.0–52.0)
Hemoglobin: 13.7 g/dL (ref 13.0–17.0)
MCH: 28.5 pg (ref 26.0–34.0)
MCHC: 33.7 g/dL (ref 30.0–36.0)
MCV: 84.4 fL (ref 78.0–100.0)
Platelets: 253 10*3/uL (ref 150–400)
RBC: 4.81 MIL/uL (ref 4.22–5.81)
RDW: 13 % (ref 11.5–15.5)
WBC: 11 10*3/uL — AB (ref 4.0–10.5)

## 2014-01-02 MED ORDER — METOPROLOL TARTRATE 25 MG PO TABS: 75.0000 mg | ORAL_TABLET | Freq: Two times a day (BID) | ORAL | Status: DC

## 2014-01-02 MED ORDER — SIMVASTATIN 10 MG PO TABS: 10.0000 mg | ORAL_TABLET | Freq: Every day | ORAL | Status: AC

## 2014-01-02 NOTE — Progress Notes (Signed)
Discharge instructions and prescriptions given   No questions.  Left via wheelchair with family.  [name]

## 2014-01-02 NOTE — Progress Notes (Addendum)
Patient: Seth Hunt / Admit Date: 12/23/2013 / Date of Encounter: 01/02/2014, 9:28 AM   Subjective  Diarrhea improved. No dyspnea. No chest pain. Life vest on.  Objective    Telemetry: NSR frequent ectopy/bigeminy/NSVT improved today, possible nonsustained atrial tach  Physical Exam: Blood pressure 98/51, pulse 66, temperature 98 F (36.7 C), temperature source Oral, resp. rate 18, height 5\' 8"  (1.727 m), weight 190 lb 9.6 oz (86.455 kg), SpO2 94.00%.  General: Well developed, well nourished, in no acute distress. Head: Normocephalic, atraumatic, sclera non-icteric, no xanthomas, nares are without discharge. Neck: Negative for carotid bruits. JVP mildly elevated. Lungs: diminished BS throughout, mild basilar rales. No wheezes or rhonchi. Breathing is unlabored. Heart: Regular but frequent ectopy. S1 S2 without murmurs, rubs, or gallops. Life vest in place Abdomen: Soft, non-tender, non-distended with normoactive bowel sounds. No rebound/guarding. Extremities: No clubbing or cyanosis. Tr edema. Distal pedal pulses are 2+ and equal bilaterally. Neuro: Alert and oriented X 3. Moves all extremities spontaneously. Psych:  Responds to questions appropriately with a normal affect.   Intake/Output Summary (Last 24 hours) at 01/02/14 0928 Last data filed at 01/02/14 0530  Gross per 24 hour  Intake      0 ml  Output    400 ml  Net   -400 ml    Inpatient Medications:  . aspirin EC  81 mg Oral Daily  . atorvastatin  20 mg Oral q1800  . dextromethorphan-guaiFENesin  1 tablet Oral BID  . enoxaparin (LOVENOX) injection  40 mg Subcutaneous Daily  . FLUoxetine  20 mg Oral q morning - 10a  . furosemide  40 mg Oral BID  . glimepiride  4 mg Oral Q breakfast  . insulin aspart  0-20 Units Subcutaneous TID WC  . insulin aspart  0-5 Units Subcutaneous QHS  . insulin aspart  4 Units Subcutaneous TID WC  . insulin detemir  13 Units Subcutaneous Daily  . ipratropium  0.5 mg Nebulization QID  .  levalbuterol  0.63 mg Nebulization QID  . metoprolol tartrate  75 mg Oral BID  . mometasone-formoterol  2 puff Inhalation BID  . potassium chloride  40 mEq Oral TID WC  . predniSONE  20 mg Oral Q breakfast  . ramipril  10 mg Oral Daily  . sodium chloride  3 mL Intravenous Q12H  . tamsulosin  0.4 mg Oral q morning - 10a   Infusions:    Labs:  Recent Labs  12/31/13 0533 01/01/14 0443 01/02/14 0638  NA 143 142 144  K 3.0* 3.6* 3.6*  CL 102 104 106  CO2 27 19 26   GLUCOSE 111* 147* 82  BUN 25* 27* 26*  CREATININE 0.90 0.84 1.03  CALCIUM 8.7 8.9 8.9  MG 2.0  --   --     Recent Labs  01/02/14 0638  WBC 11.0*  HGB 13.7  HCT 40.6  MCV 84.4  PLT 253     Radiology/Studies:  Dg Chest 2 View  12/31/2013   CLINICAL DATA:  Cough and congestion and flu-like symptoms.  EXAM: CHEST  2 VIEW  COMPARISON:  PA and lateral chest x-ray dated December 26, 2013.  FINDINGS: The lungs are adequately inflated. There is no focal infiltrate. There remain coarse lung markings over the lower thoracic spine on the lateral film. The cardiac silhouette is normal in size. The pulmonary vascularity is not engorged. The frontal film correction both the frontal and lateral films are limited due to overlying electronic devices. The observed portions of  the bony thorax exhibit no acute abnormalities.  IMPRESSION: There is no evidence of pneumonia nor CHF. There is likely underlying COPD.   Electronically Signed   By: David  [place]   On: 12/31/2013 17:16      Assessment and Plan   1. COPD exacerbation with acute respiratory failure (presenting O2 sat 87%) - improving. May need home O2. 2. Influenza A - tx with Tamiflu.  3. 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 Recent cardiac catheterization did not demonstrate any flow limiting disease. 50% LAD, mild RCA disease. I do not strongly  believe that a repeat catheterization or stress test is necessary (agree with Dr. Antoine Poche). Mildly elevated troponin in the setting of the lens/COPD. He is not having any anginal discomfort. 4. Nonischemic cardiomyopathy - etiology unclear. EF 30-35% by echo this adm (40% by cath 04/2013). Has been on ACEI/BB at home. Continue Lasix. Potassium is being repleted. Favor continuing metoprolol over coreg given its selectivity with his emphysema.  5. Frequent PVCs/NSVT - has had issues with hypokalemia, continue to monitor/replete. I am concerned frequent ectopy may contributing to worsening ectopy. PVCs noted in procedure note from stress echo 04/2013. Zoll lifevest in place (ordered by Dr. Tobias Alexander).  EP evaluation as outpatient for decreased EF, NSVT, and frequent multifocal PVCs.   6. Paroxysmal atrial tachycardia - may be due to beta agonist and acute illness. Beta blocker titrated. Potassium being repleted. 7. Anemia/thrombocytopenia - resolved. 8. Diarrhea - improved.  I am also hesitant to start amiodarone given his underlying lung condition. Bisoprolol may be an option.  Primary cardiologist is Angelina Sheriff, MD (did original consult).

## 2014-01-02 NOTE — Discharge Summary (Signed)
Physician Discharge Summary  Patient ID: Seth Hunt MRN: [id number] DOB/AGE: May 11, 1933 80 y.o.  Admit date: 12/23/2013 Discharge date: 01/02/2014  Primary Care Physician:  Harlan StainsOE,[name], MD  Discharge Diagnoses:   . Acute hypoxic respiratory failure  . Influenza A . COPD (chronic obstructive pulmonary disease) exacerbation  . 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 DM (diabetes mellitus) . HTN (hypertension) . Troponin level elevated/NSTEMI . Influenza A . Depression . Thrombocytopenia, unspecified . NICM (nonischemic cardiomyopathy) . Frequent PVCs . Diarrhea-C. difficile negative   Consults:  Cardiology, Dr. Antoine PocheHochrein, Dr. Delton SeeNelson, Dr. Anne FuSkains   Recommendations for Outpatient Follow-up:  Patient needs to have CBC, BMET for renal function and potassium level checked within 7-10 days with PCP followup  Allergies:  No Known Allergies   Discharge Medications:   Medication List         aspirin EC 81 MG tablet  Take 81 mg by mouth daily.     dextromethorphan-guaiFENesin 30-600 MG per 12 hr tablet  Commonly known as:  MUCINEX DM  Take 1 tablet by mouth 2 (two) times daily.     FLUoxetine 20 MG capsule  Commonly known as:  PROZAC  Take 20 mg by mouth every morning.     furosemide 40 MG tablet  Commonly known as:  LASIX  Take 1 tablet (40 mg total) by mouth 2 (two) times daily.     glimepiride 4 MG tablet  Commonly known as:  AMARYL  Take 4 mg by mouth daily.     levalbuterol 0.63 MG/3ML nebulizer solution  Commonly known as:  XOPENEX  Take 3 mLs (0.63 mg total) by nebulization 3 (three) times daily. And q4hours as needed for SOB or wheezing     metFORMIN 1000 MG tablet  Commonly known as:  GLUCOPHAGE  Take 1,000 mg by mouth 2 (two) times daily.     metoprolol tartrate 25 MG tablet  Commonly known as:  LOPRESSOR  Take 3 tablets (75 mg total) by mouth 2 (two) times daily.     mometasone-formoterol 100-5 MCG/ACT Aero  Commonly  known as:  DULERA  Inhale 2 puffs into the lungs 2 (two) times daily.     potassium chloride SA 20 MEQ tablet  Commonly known as:  K-DUR,KLOR-CON  Take 2 tablets (40 mEq total) by mouth 2 (two) times daily.     predniSONE 10 MG tablet  Commonly known as:  DELTASONE  Prednisone dosing: Take  Prednisone 30mg  (3 tabs) x 1 days, then 20mg  (2 tabs) x 3days, then 10mg  (1 tab) x 3days, then OFF.     ramipril 10 MG capsule  Commonly known as:  ALTACE  Take 10 mg by mouth daily.     simvastatin 10 MG tablet  Commonly known as:  ZOCOR  Take 1 tablet (10 mg total) by mouth at bedtime.     tamsulosin 0.4 MG Caps capsule  Commonly known as:  FLOMAX  Take 0.4 mg by mouth every morning.     tiotropium 18 MCG inhalation capsule  Commonly known as:  SPIRIVA HANDIHALER  Place 1 capsule (18 mcg total) into inhaler and inhale daily.         Brief H and P: For complete details please refer to admission H and P, but in brief patient is a 80 year old male with DM, HTN, HPL, COPD presented with SOB, non productive cough, associated with DOE, chills after recent URI symptoms; patient found to have mild hypoxia in ED which improved after  inhalers+oxygen; denied any chest pain, no palpitations, no dizziness, no diaphoresis, no nausea, vomiting or diarrhea;    Hospital Course:   Acute hypoxic respiratory failure: Significantly improved, patient was found to have influenza A+ and was placed on Tamiflu. Subsequently he was had COPD exacerbation with acute bronchitis. This has been improving however he does need home O2 which was arranged by the home health. 1) Influenza A with COPD exacerbation- Improving slowly, will need home O2 at DC still coughing but significantly improving.. Patient was placed on Tamiflu, he will continue prednisone with taper, continue bronchodilators, dulera. Home O2 evaluation was done and patient was recommended O2 2 L, continuous. Repeat chest x-ray done on 1/18 showed no  evidence of pneumonia or CHF, underlying COPD   2) acute on chronic systolic and diastolic CHF : Cardiology consult was obtained and patient was closely followed by cardiology. He is currently on oral Lasix 40 mg BID  with potassium replacement. Currently -1.2 L balance on I's and O's.  2-D echo  showed EF 30-35% with grade 1 diastolic dysfunction. Patient was recommended ICD, for now life vest placed. He has an appointment for EP outpatient with Dr. Clide Cliff.    Diarrhea:-  improving, C. difficile is negative   NSTEMI/ Troponin level elevated, likely demand ischemia, EF 30-35% by echo this adm  -  patient was followed by cardiology, continue aspirin, statin, beta blocker . Beta blocker was titrated up due to frequent PVCs/NSVT.  Zoll lifevest in place (ordered by Dr. Tobias Alexander). EP evaluation as outpatient for decreased EF, NSVT, and frequent multifocal PVCs. Patient was recommended outpatient stress test once pulmonology issues are resolved. Outpatient EP evaluation.   Hypokalemia: Replaced   DM (diabetes mellitus) patient was placed on insulin in patient, will continue outpatient oral hypoglycemics   HTN (hypertension): Stable   Depression- stable   Thrombocytopenia, unspecified- likely due to acute illness, resolved    Day of Discharge BP 98/51  Pulse 66  Temp(Src) 98 F (36.7 C) (Oral)  Resp 18  Ht 5\' 8"  (1.727 m)  Wt 86.455 kg (190 lb 9.6 oz)  BMI 28.99 kg/m2  SpO2 94%  Physical Exam: General: Alert and awake oriented x3 not in any acute distress. CVS: S1-S2 clear no murmur rubs or gallops Chest:  diminished breath sounds, mild by baseline  Abdomen: soft nontender, nondistended, normal bowel sounds Extremities: no cyanosis, clubbing, trace  edema noted bilaterally Neuro: Cranial nerves II-XII intact, no focal neurological deficits   The results of significant diagnostics from this hospitalization (including imaging, microbiology, ancillary and laboratory) are listed  below for reference.    LAB RESULTS: Basic Metabolic Panel:  Recent Labs Lab 12/31/13 0533 01/01/14 0443 01/02/14 0638  NA 143 142 144  K 3.0* 3.6* 3.6*  CL 102 104 106  CO2 27 19 26   GLUCOSE 111* 147* 82  BUN 25* 27* 26*  CREATININE 0.90 0.84 1.03  CALCIUM 8.7 8.9 8.9  MG 2.0  --   --    Liver Function Tests: No results found for this basename: AST, ALT, ALKPHOS, BILITOT, PROT, ALBUMIN,  in the last 168 hours No results found for this basename: LIPASE, AMYLASE,  in the last 168 hours No results found for this basename: AMMONIA,  in the last 168 hours CBC:  Recent Labs Lab 12/30/13 0500 01/02/14 0638  WBC 10.7* 11.0*  HGB 13.4 13.7  HCT 39.7 40.6  MCV 84.6 84.4  PLT 212 253   Cardiac Enzymes:  Recent Labs  Lab 12/26/13 1315  TROPONINI <0.30   BNP: No components found with this basename: POCBNP,  CBG:  Recent Labs Lab 01/01/14 2126 01/02/14 0826  GLUCAP 84 88    Significant Diagnostic Studies:  Dg Chest Port 1 View  12/23/2013   CLINICAL DATA:  Shortness of breath  EXAM: PORTABLE CHEST - 1 VIEW  COMPARISON:  None.  FINDINGS: The cardiac shadow is within normal limits. The lungs are well aerated bilaterally. No focal infiltrate or sizable effusion is seen.  IMPRESSION: No acute abnormality noted.   Electronically Signed   By: Alcide Clever M.D.   On: 12/23/2013 19:09    2D ECHO:   Disposition and Follow-up: Discharge Orders   Future Appointments Provider Department Dept Phone   02/07/2014 10:45 AM Duke Salvia, MD [facility] Office [phone]   Future Orders Complete By Expires   (HEART FAILURE PATIENTS) Call MD:  Anytime you have any of the following symptoms: 1) 3 pound weight gain in 24 hours or 5 pounds in 1 week 2) shortness of breath, with or without a dry hacking cough 3) swelling in the hands, feet or stomach 4) if you have to sleep on extra pillows at night in order to breathe.  As directed    Diet - low sodium heart healthy   As directed    Diet Carb Modified  As directed    Increase activity slowly  As directed        DISPOSITION: Home  DIET:  carb modified diet  TESTS THAT NEED FOLLOW-UP  BMET, CBC   DISCHARGE FOLLOW-UP Follow-up Information   Follow up with [facility]. (oxygen and nebulizer machine)    Contact information:   [address] [place] [address] [phone]       Follow up with [facility]. (home health nurse)    Contact information:   [address] [place] [address] [phone]       Follow up with COE,[name], MD. Schedule an appointment as soon as possible for a visit in 10 days. (please have BMET checked on your appt with potassium and your kidney function )    Contact information:   [address] [place] [address] [phone]       Follow up with Sherryl Manges, MD On 02/07/2014. (at 10:45 AM)    Specialty:  Cardiology   Contact information:   [address] [place] [address] [phone]       Time spent on Discharge:  45 minutes   Signed:   Vardaan Depascale M.D. [facility] 01/02/2014, 11:29 AM Pager: [phone]

## 2014-02-05 ENCOUNTER — Encounter: Payer: Self-pay | Admitting: *Deleted

## 2014-02-07 ENCOUNTER — Ambulatory Visit: Payer: Medicare Other | Admitting: Internal Medicine

## 2014-02-21 ENCOUNTER — Encounter: Payer: Self-pay | Admitting: Internal Medicine

## 2014-02-21 ENCOUNTER — Ambulatory Visit (INDEPENDENT_AMBULATORY_CARE_PROVIDER_SITE_OTHER): Payer: Medicare Other | Admitting: Internal Medicine

## 2014-02-21 VITALS — BP 150/71 | HR 50 | Ht 67.0 in | Wt 190.0 lb

## 2014-02-21 DIAGNOSIS — I429 Cardiomyopathy, unspecified: Secondary | ICD-10-CM

## 2014-02-21 DIAGNOSIS — I428 Other cardiomyopathies: Secondary | ICD-10-CM

## 2014-02-21 DIAGNOSIS — I4729 Other ventricular tachycardia: Secondary | ICD-10-CM

## 2014-02-21 DIAGNOSIS — I493 Ventricular premature depolarization: Secondary | ICD-10-CM

## 2014-02-21 DIAGNOSIS — I472 Ventricular tachycardia: Secondary | ICD-10-CM

## 2014-02-21 DIAGNOSIS — I4949 Other premature depolarization: Secondary | ICD-10-CM

## 2014-02-21 MED ORDER — METOPROLOL SUCCINATE ER 50 MG PO TB24: 50.0000 mg | ORAL_TABLET | Freq: Every day | ORAL | Status: DC

## 2014-02-21 NOTE — Patient Instructions (Signed)
Your physician has recommended that you wear a holter monitor. Holter monitors are medical devices that record the heart's electrical activity. Doctors most often use these monitors to diagnose arrhythmias. Arrhythmias are problems with the speed or rhythm of the heartbeat. The monitor is a small, portable device. You can wear one while you do your normal daily activities. This is usually used to diagnose what is causing palpitations/syncope (passing out).  Your physician has requested that you have an echocardiogram. Echocardiography is a painless test that uses sound waves to create images of your heart. It provides your doctor with information about the size and shape of your heart and how well your heart's chambers and valves are working. This procedure takes approximately one hour. There are no restrictions for this procedure.  Your physician has recommended you make the following change in your medication:  1) Stop Metoprolol tartrate 2) Start Metoprolol succinate 50 mg daily  Your physician recommends that you schedule a follow-up appointment in: 4-5 weeks

## 2014-02-21 NOTE — Progress Notes (Signed)
ELECTROPHYSIOLOGY CONSULT NOTE  Patient ID: Seth Hunt, MRN: [id number], DOB/AGE: [birth year] 80 y.o. Admit date: (Not on file) Date of Consult: 02/21/2014  Primary Physician: Harlan StainsOE,[name], MD Primary Cardiologist: skains Chief Complaint:  pvc  HPI Seth Hunt is a 80 y.o. male  Seen following hospitalization for COPD exacerbation complicated by influenza 82 where he was noted to have frequent ventricular ectopy. Ejection fraction was noted at 30-35% with catheterization a year ago demonstrating only mild left coronary artery disease with a similar degree of LV function  Telemetry was reviewed from the hospital and demonstrated multifocal PVCs. PVC burden is not available.labs are notable for hypokalemia for which I do not see a recheck.  He has chronic dyspnea on exertion without peripheral edema or orthopnea. He has few palpitations. He's had no syncope.  He is tolerating his LifeVest with  Amazing equanimity.  Past Medical History  Diagnosis Date  . COPD (chronic obstructive pulmonary disease)   . Diabetes mellitus without complication   . BPH (benign prostatic hyperplasia)   . CAD (coronary artery disease)     a. 04/2013 - abnl stress echo with possible fall in EF. f/u cath - 50% mid LAD lesion, an anomalous patent circumflex and mild RCA lesions. EF 40%.  . Cardiomyopathy     a. 04/2013: EF 40% by cath.      Surgical History:  Past Surgical History  Procedure Laterality Date  . Back surgery    . Cholecystectomy    . Hernia repair      bilateral inguinal hernia     Home Meds: Prior to Admission medications   Medication Sig Start Date End Date Taking? Authorizing Provider  aspirin EC 81 MG tablet Take 81 mg by mouth daily.   Yes Historical Provider, MD  dextromethorphan-guaiFENesin (MUCINEX DM) 30-600 MG per 12 hr tablet Take 1 tablet by mouth 2 (two) times daily. 12/31/13  Yes Ripudeep Jenna LuoK Rai, MD  FLUoxetine (PROZAC) 20 MG capsule Take 20 mg by mouth every morning.    Yes Historical Provider, MD  furosemide (LASIX) 40 MG tablet Take 1 tablet (40 mg total) by mouth 2 (two) times daily. 12/31/13  Yes Ripudeep K Rai, MD  glimepiride (AMARYL) 4 MG tablet Take 4 mg by mouth daily.   Yes Historical Provider, MD  levalbuterol (XOPENEX) 0.63 MG/3ML nebulizer solution Take 3 mLs (0.63 mg total) by nebulization 3 (three) times daily. And q4hours as needed for SOB or wheezing 12/31/13  Yes Ripudeep K Rai, MD  metFORMIN (GLUCOPHAGE) 1000 MG tablet Take 1,000 mg by mouth 2 (two) times daily.   Yes Historical Provider, MD  metoprolol tartrate (LOPRESSOR) 25 MG tablet Take 3 tablets (75 mg total) by mouth 2 (two) times daily. 01/02/14  Yes Ripudeep Jenna LuoK Rai, MD  mometasone-formoterol (DULERA) 100-5 MCG/ACT AERO Inhale 2 puffs into the lungs 2 (two) times daily. 12/31/13  Yes Ripudeep Jenna LuoK Rai, MD  predniSONE (DELTASONE) 10 MG tablet Prednisone dosing: Take  Prednisone 30mg  (3 tabs) x 1 days, then 20mg  (2 tabs) x 3days, then 10mg  (1 tab) x 3days, then OFF. 01/01/14  Yes Ripudeep K Rai, MD  ramipril (ALTACE) 10 MG capsule Take 10 mg by mouth daily.   Yes Historical Provider, MD  simvastatin (ZOCOR) 10 MG tablet Take 1 tablet (10 mg total) by mouth at bedtime. 01/02/14  Yes Ripudeep Jenna LuoK Rai, MD  tamsulosin (FLOMAX) 0.4 MG CAPS capsule Take 0.4 mg by mouth every morning.    Yes Historical Provider, MD  Allergies: No Known Allergies  History   Social History  . Marital Status: Widowed    Spouse Name: N/A    Number of Children: 0  . Years of Education: N/A   Occupational History  . Retired      Press photographer   Social History Main Topics  . Smoking status: Former Smoker -- 1.00 packs/day for 20 years    Types: Cigarettes  . Smokeless tobacco: Not on file     Comment: Quit 20 years ago  . Alcohol Use: No  . Drug Use: No  . Sexual Activity: Not on file   Other Topics Concern  . Not on file   Social History Narrative   Lives with one stepchild.  He has three other stepchildren.        Family History  Problem Relation Age of Onset  . CAD Father 55  . CAD Mother 64    Lived until age 58  . Cancer Sister   . Heart attack Brother   . Stroke Brother   . Diabetes Brother      ROS:  Please see the history of present illness.     All other systems reviewed and negative.    Physical Exam:*   Blood pressure 150/71, pulse 50, height 5\' 7"  (1.702 m), weight 190 lb (86.183 kg). General: Well developed, well nourished male in no acute distress. Head: Normocephalic, atraumatic, sclera non-icteric, no xanthomas, nares are without discharge. EENT: normal Lymph Nodes:  none Back: without scoliosis/kyphosis, no CVA tendersness Neck: Negative for carotid bruits. JVD not elevated. Lungs: Clear bilaterally to auscultation without wheezes, rales, or rhonchi. Breathing is unlabored. Heart: RRR with dinminshed S1 S2. 2/6 murmur , rubs, or gallops appreciated. Abdomen: Soft, non-tender, non-distended with normoactive bowel sounds. No hepatomegaly. No rebound/guarding. No obvious abdominal masses. Msk:  Strength and tone appear normal for age. Extremities: No clubbing or cyanosis. No edema.  Distal pedal pulses are 2+ and equal bilaterally. Skin: Warm and Dry Neuro: Alert and oriented X 3. CN III-XII intact Grossly normal sensory and motor function . Psych:  Responds to questions appropriately with a normal affect.      Labs: Cardiac Enzymes No results found for this basename: CKTOTAL, CKMB, TROPONINI,  in the last 72 hours CBC Lab Results  Component Value Date   WBC 11.0* 01/02/2014   HGB 13.7 01/02/2014   HCT 40.6 01/02/2014   MCV 84.4 01/02/2014   PLT 253 01/02/2014   PROTIME: No results found for this basename: LABPROT, INR,  in the last 72 hours Chemistry No results found for this basename: NA, K, CL, CO2, BUN, CREATININE, CALCIUM, LABALBU, PROT, BILITOT, ALKPHOS, ALT, AST, GLUCOSE,  in the last 168 hours Lipids No results found for this basename: CHOL, HDL, LDLCALC,  TRIG   BNP Pro B Natriuretic peptide (BNP)  Date/Time Value Ref Range Status  12/31/2013  5:33 AM 1561.0* 0 - 450 pg/mL Final  12/28/2013  5:41 AM 3479.0* 0 - 450 pg/mL Final  12/26/2013 11:05 AM 15982.0* 0 - 450 pg/mL Final   Miscellaneous No results found for this basename: DDIMER    Radiology/Studies:  No results found.  EKG:  Sinus rhythm at 52  Interval 22/09/45 Low-voltage  Assessment and Plan:  PVCs  Cardiomyopathy primarily nonischemic  COPD/flu hospitalization associated with hypokalemia  There are two issues, the first is whether the density of PVCs are sufficiently great to be responsible for his cardiomyopathy; based on auscultation today I suspect that they are not.  We will look for this with a 24-hour Holter  The next issue is whether his cardiomyopathy has recovered all as it had been in the 40% range previously. We will repeat an echo. In the interim we will change his beta blocker from tartrate--succinate; in the event that his ejection fraction is low we will begin him on Aldactone.  He is to see his PCP later this week with followup discussion regarding potassium and magnesium levels.  As relates to his LifeVest I don't feel strongly that he keep it on. I think he'll plan to keep it on however to get the results of the aforementioned testing.  There is no evidence of volume overload. I suspect that most of his dyspnea on exertion is in fact  COPD as previously suggested although he has had an elevated BNP in the past.     Sherryl Manges

## 2014-02-23 ENCOUNTER — Encounter: Payer: Self-pay | Admitting: Internal Medicine

## 2014-03-01 ENCOUNTER — Encounter: Payer: Self-pay | Admitting: *Deleted

## 2014-03-01 ENCOUNTER — Encounter (INDEPENDENT_AMBULATORY_CARE_PROVIDER_SITE_OTHER): Payer: Medicare Other

## 2014-03-01 DIAGNOSIS — I428 Other cardiomyopathies: Secondary | ICD-10-CM

## 2014-03-01 DIAGNOSIS — I472 Ventricular tachycardia: Secondary | ICD-10-CM

## 2014-03-01 DIAGNOSIS — I493 Ventricular premature depolarization: Secondary | ICD-10-CM

## 2014-03-01 DIAGNOSIS — I4729 Other ventricular tachycardia: Secondary | ICD-10-CM

## 2014-03-01 DIAGNOSIS — I4949 Other premature depolarization: Secondary | ICD-10-CM

## 2014-03-01 NOTE — Progress Notes (Signed)
Patient ID: Seth Hunt, male   DOB: 1933-01-22, 80 y.o.   MRN: [id number] EVO 24 hour holter monitor applied to patient.

## 2014-03-07 ENCOUNTER — Ambulatory Visit (HOSPITAL_COMMUNITY): Payer: Medicare Other | Attending: Cardiology | Admitting: Radiology

## 2014-03-07 ENCOUNTER — Encounter: Payer: Self-pay | Admitting: Cardiology

## 2014-03-07 DIAGNOSIS — I429 Cardiomyopathy, unspecified: Secondary | ICD-10-CM

## 2014-03-07 DIAGNOSIS — I428 Other cardiomyopathies: Secondary | ICD-10-CM | POA: Insufficient documentation

## 2014-03-07 DIAGNOSIS — R011 Cardiac murmur, unspecified: Secondary | ICD-10-CM

## 2014-03-07 NOTE — Progress Notes (Signed)
Echocardiogram performed.  

## 2014-03-26 ENCOUNTER — Encounter: Payer: Self-pay | Admitting: Internal Medicine

## 2014-03-26 ENCOUNTER — Ambulatory Visit (INDEPENDENT_AMBULATORY_CARE_PROVIDER_SITE_OTHER): Payer: Medicare Other | Admitting: Internal Medicine

## 2014-03-26 VITALS — BP 156/68 | HR 78 | Ht 71.0 in | Wt 187.0 lb

## 2014-03-26 DIAGNOSIS — I4729 Other ventricular tachycardia: Secondary | ICD-10-CM

## 2014-03-26 DIAGNOSIS — R0989 Other specified symptoms and signs involving the circulatory and respiratory systems: Secondary | ICD-10-CM

## 2014-03-26 DIAGNOSIS — I493 Ventricular premature depolarization: Secondary | ICD-10-CM

## 2014-03-26 DIAGNOSIS — I428 Other cardiomyopathies: Secondary | ICD-10-CM

## 2014-03-26 DIAGNOSIS — I4949 Other premature depolarization: Secondary | ICD-10-CM

## 2014-03-26 DIAGNOSIS — I472 Ventricular tachycardia: Secondary | ICD-10-CM

## 2014-03-26 DIAGNOSIS — R0600 Dyspnea, unspecified: Secondary | ICD-10-CM

## 2014-03-26 DIAGNOSIS — R0609 Other forms of dyspnea: Secondary | ICD-10-CM

## 2014-03-26 LAB — TSH: TSH: 0.32 u[IU]/mL — ABNORMAL LOW (ref 0.35–5.50)

## 2014-03-26 MED ORDER — AMIODARONE HCL 200 MG PO TABS: ORAL_TABLET | ORAL | Status: DC

## 2014-03-26 NOTE — Patient Instructions (Addendum)
Your physician has recommended you make the following change in your medication:  1) Start Amiodarone  - take 400 mg twice daily for 2 weeks, then decrease to   - 400 mg once daily for 2 weeks, then decrease to   - 200 mg daily  Your physician recommends that you return for lab work in: [facility]  Your physician has recommended that you have a pulmonary function test. Pulmonary Function Tests are a group of tests that measure how well air moves in and out of your lungs.  Your physician has recommended that you wear a 24 hour holter monitor in 10 weeks. Holter monitors are medical devices that record the heart's electrical activity. Doctors most often use these monitors to diagnose arrhythmias. Arrhythmias are problems with the speed or rhythm of the heartbeat. The monitor is a small, portable device. You can wear one while you do your normal daily activities. This is usually used to diagnose what is causing palpitations/syncope (passing out).  Your physician recommends that you schedule a follow-up appointment in: 12 weeks with Dr. Graciela Husbands

## 2014-03-26 NOTE — Progress Notes (Signed)
Patient Care Team: Seth StainsLori Coe, MD as PCP - General   HPI  Seth Hunt is a 80 y.o. male Seen in followup for PVCs and nonischemic cardiomyopathy.  When we saw him in March of auscultation there he was not all that great. However, Holter monitoring demonstrated about 18% knees. Ejection fraction remains depressed at 35%. Morphologies were dominant we'll do some degree of variability.  He denies complaints of chest pain or shortness of breath  Past Medical History  Diagnosis Date  . COPD (chronic obstructive pulmonary disease)   . Diabetes mellitus without complication   . BPH (benign prostatic hyperplasia)   . CAD (coronary artery disease)     a. 04/2013 - abnl stress echo with possible fall in EF. f/u cath - 50% mid LAD lesion, an anomalous patent circumflex and mild RCA lesions. EF 40%.  . Cardiomyopathy     a. 04/2013: EF 40% by cath.    Past Surgical History  Procedure Laterality Date  . Back surgery    . Cholecystectomy    . Hernia repair      bilateral inguinal hernia    Current Outpatient Prescriptions  Medication Sig Dispense Refill  . aspirin EC 81 MG tablet Take 81 mg by mouth daily.      Marland Kitchen. dextromethorphan-guaiFENesin (MUCINEX DM) 30-600 MG per 12 hr tablet Take 1 tablet by mouth 2 (two) times daily.  60 tablet  0  . FLUoxetine (PROZAC) 20 MG capsule Take 20 mg by mouth every morning.      . furosemide (LASIX) 40 MG tablet Take 1 tablet (40 mg total) by mouth 2 (two) times daily.  60 tablet  3  . glimepiride (AMARYL) 4 MG tablet Take 4 mg by mouth daily.      Marland Kitchen. levalbuterol (XOPENEX) 0.63 MG/3ML nebulizer solution Take 3 mLs (0.63 mg total) by nebulization 3 (three) times daily. And q4hours as needed for SOB or wheezing  3 mL  12  . metFORMIN (GLUCOPHAGE) 1000 MG tablet Take 1,000 mg by mouth 2 (two) times daily.      . metoprolol succinate (TOPROL XL) 50 MG 24 hr tablet Take 1 tablet (50 mg total) by mouth daily. Take with or immediately following a meal.   30 tablet  2  . ramipril (ALTACE) 10 MG capsule Take 10 mg by mouth daily.      . simvastatin (ZOCOR) 10 MG tablet Take 1 tablet (10 mg total) by mouth at bedtime.  30 tablet  3  . tamsulosin (FLOMAX) 0.4 MG CAPS capsule Take 0.4 mg by mouth every morning.        No current facility-administered medications for this visit.    No Known Allergies  Review of Systems negative except from HPI and PMH  Physical Exam BP 156/68  Pulse 78  Ht 5\' 11"  (1.803 m)  Wt 187 lb (84.823 kg)  BMI 26.09 kg/m2 Well developed and well nourished in no acute distress HENT normal E scleral and icterus clear Neck Supple JVP flat; carotids brisk and full Clear to ausculation Irregular rate and rhythm, no murmurs gallops or rub Soft with active bowel sounds No clubbing cyanosis  Edema Alert and oriented, grossly normal motor and sensory function Skin Warm and Dry    Assessment and  Plan  Nonischemic cardio myopathy  PVCs  We will plan to begin amiodarone for arrhythmic suppression as there are multiple morphologies in his Holter monitor although there is a dominant morphology. I suspect  this may represent exit site variability as opposed to focus of variability. There is a second morphology of poorly quantitated.   we have reviewed risks and benefits. We'll check a TSH and ovary function tests at this point. In 10 weeks we will get a Holter monitor and I will see him at that juncture. If we have suppression of ventricular ectopy we will repeat an echocardiogram.  With his most recent ejection fraction of 35%, I think it is reasonable to discontinue his LifeVest.

## 2014-04-13 ENCOUNTER — Other Ambulatory Visit: Payer: Self-pay | Admitting: *Deleted

## 2014-04-13 DIAGNOSIS — R899 Unspecified abnormal finding in specimens from other organs, systems and tissues: Secondary | ICD-10-CM

## 2014-04-18 ENCOUNTER — Other Ambulatory Visit (INDEPENDENT_AMBULATORY_CARE_PROVIDER_SITE_OTHER): Payer: Medicare Other

## 2014-04-18 ENCOUNTER — Ambulatory Visit (INDEPENDENT_AMBULATORY_CARE_PROVIDER_SITE_OTHER): Payer: Medicare Other | Admitting: Internal Medicine

## 2014-04-18 DIAGNOSIS — I428 Other cardiomyopathies: Secondary | ICD-10-CM

## 2014-04-18 DIAGNOSIS — R899 Unspecified abnormal finding in specimens from other organs, systems and tissues: Secondary | ICD-10-CM

## 2014-04-18 DIAGNOSIS — R6889 Other general symptoms and signs: Secondary | ICD-10-CM

## 2014-04-18 DIAGNOSIS — J449 Chronic obstructive pulmonary disease, unspecified: Secondary | ICD-10-CM

## 2014-04-18 DIAGNOSIS — R0600 Dyspnea, unspecified: Secondary | ICD-10-CM

## 2014-04-18 LAB — T3, FREE: T3 FREE: 2.5 pg/mL (ref 2.3–4.2)

## 2014-04-18 LAB — T4, FREE: Free T4: 1.14 ng/dL (ref 0.60–1.60)

## 2014-04-18 NOTE — Progress Notes (Signed)
PFT done today. 

## 2014-04-21 LAB — PULMONARY FUNCTION TEST
DL/VA % pred: 68 %
DL/VA: 3.07 ml/min/mmHg/L
DLCO UNC % PRED: 58 %
DLCO unc: 17.43 ml/min/mmHg
FEF 25-75 Post: 1.02 L/sec
FEF 25-75 Pre: 0.47 L/sec
FEF2575-%Change-Post: 115 %
FEF2575-%PRED-POST: 55 %
FEF2575-%PRED-PRE: 25 %
FEV1-%Change-Post: 37 %
FEV1-%PRED-POST: 57 %
FEV1-%Pred-Pre: 42 %
FEV1-PRE: 1.12 L
FEV1-Post: 1.54 L
FEV1FVC-%Change-Post: 7 %
FEV1FVC-%PRED-PRE: 66 %
FEV6-%CHANGE-POST: 26 %
FEV6-%PRED-POST: 81 %
FEV6-%PRED-PRE: 64 %
FEV6-POST: 2.84 L
FEV6-Pre: 2.24 L
FEV6FVC-%CHANGE-POST: 0 %
FEV6FVC-%PRED-POST: 102 %
FEV6FVC-%Pred-Pre: 103 %
FVC-%CHANGE-POST: 27 %
FVC-%Pred-Post: 79 %
FVC-%Pred-Pre: 62 %
FVC-POST: 2.98 L
FVC-Pre: 2.34 L
PRE FEV1/FVC RATIO: 48 %
PRE FEV6/FVC RATIO: 96 %
Post FEV1/FVC ratio: 51 %
Post FEV6/FVC ratio: 95 %

## 2014-04-30 ENCOUNTER — Encounter: Payer: Self-pay | Admitting: Internal Medicine

## 2014-04-30 NOTE — Telephone Encounter (Signed)
This encounter was created in error - please disregard.

## 2014-04-30 NOTE — Telephone Encounter (Signed)
New message    Calling for test results  

## 2014-05-10 ENCOUNTER — Other Ambulatory Visit: Payer: Self-pay | Admitting: *Deleted

## 2014-05-10 MED ORDER — METOPROLOL SUCCINATE ER 50 MG PO TB24: 50.0000 mg | ORAL_TABLET | Freq: Every day | ORAL | Status: DC

## 2014-06-04 ENCOUNTER — Encounter: Payer: Self-pay | Admitting: *Deleted

## 2014-06-04 ENCOUNTER — Encounter (INDEPENDENT_AMBULATORY_CARE_PROVIDER_SITE_OTHER): Payer: Medicare Other

## 2014-06-04 DIAGNOSIS — I4949 Other premature depolarization: Secondary | ICD-10-CM

## 2014-06-04 DIAGNOSIS — I493 Ventricular premature depolarization: Secondary | ICD-10-CM

## 2014-06-04 DIAGNOSIS — I472 Ventricular tachycardia: Secondary | ICD-10-CM

## 2014-06-04 DIAGNOSIS — I4729 Other ventricular tachycardia: Secondary | ICD-10-CM

## 2014-06-04 NOTE — Progress Notes (Signed)
Patient ID: Seth Hunt, male   DOB: 05/26/1933, 81 y.o.   MRN: [id number] [facility] 24 hour holter monitor applied to patient. 

## 2014-06-07 ENCOUNTER — Telehealth: Payer: Self-pay | Admitting: *Deleted

## 2014-06-07 MED ORDER — AMIODARONE HCL 200 MG PO TABS: 100.0000 mg | ORAL_TABLET | Freq: Every day | ORAL | Status: DC

## 2014-06-07 NOTE — Telephone Encounter (Signed)
Provided information to Dr. Benjiman Core, RN. Orders obtained: 1) patient to D/C Metoprolol, 2) decrease Amiodarone to 100 mg by mouth daily, and 3) keep up coming appointment with Dr. Graciela Husbands on 7/11.  Patient was not at home right now but his daughter took the message and verbalized understanding and read back instructions regarding Metoprolol and Amiodarone changes and upcoming appointment/Holter monitor results. Stated should patient have any questions or concerns to please call office back at any time.

## 2014-06-07 NOTE — Telephone Encounter (Signed)
Rec'd call from Christy/[facility] re: STAT results from Holter monitor. She will also fax results at this time. Results = 995 pauses d/t 2nd Degree Block and 3 Ventricular Runs of 13 beat atrial run ends with concealed conduction.

## 2014-06-26 ENCOUNTER — Encounter: Payer: Self-pay | Admitting: Internal Medicine

## 2014-06-26 ENCOUNTER — Ambulatory Visit (INDEPENDENT_AMBULATORY_CARE_PROVIDER_SITE_OTHER): Payer: Medicare Other | Admitting: Internal Medicine

## 2014-06-26 VITALS — BP 151/68 | HR 80 | Ht 68.0 in | Wt 195.0 lb

## 2014-06-26 DIAGNOSIS — R946 Abnormal results of thyroid function studies: Secondary | ICD-10-CM

## 2014-06-26 DIAGNOSIS — I4949 Other premature depolarization: Secondary | ICD-10-CM

## 2014-06-26 DIAGNOSIS — I493 Ventricular premature depolarization: Secondary | ICD-10-CM

## 2014-06-26 DIAGNOSIS — I428 Other cardiomyopathies: Secondary | ICD-10-CM

## 2014-06-26 NOTE — Patient Instructions (Addendum)
Your physician recommends that you continue on your current medications as directed. Please refer to the Current Medication list given to you today.  Your physician has recommended that you wear a 24 hour holter monitor before coming back to see Dr. Graciela Husbands. Holter monitors are medical devices that record the heart's electrical activity. Doctors most often use these monitors to diagnose arrhythmias. Arrhythmias are problems with the speed or rhythm of the heartbeat. The monitor is a small, portable device. You can wear one while you do your normal daily activities. This is usually used to diagnose what is causing palpitations/syncope (passing out).  Your physician recommends that you schedule a follow-up appointment in: 3 months with Dr. Graciela Husbands.   Lab today: TSH

## 2014-06-26 NOTE — Progress Notes (Signed)
f      Patient Care Team: Harlan StainsLori Coe, MD as PCP - General   HPI  Seth BussingWesley Hunt is a 80 y.o. male With frequent PVCs of multiple morphologies. We elected because of that his amiodarone to see if we can suppress them.  Holter monitoring demonstrated significant suppression of ventricular ectopy now down to 0.3%. He feels good. He denies cough. He does have some sensitivity. There is no nausea.  Past Medical History  Diagnosis Date  . COPD (chronic obstructive pulmonary disease)   . Diabetes mellitus without complication   . BPH (benign prostatic hyperplasia)   . CAD (coronary artery disease)     a. 04/2013 - abnl stress echo with possible fall in EF. f/u cath - 50% mid LAD lesion, an anomalous patent circumflex and mild RCA lesions. EF 40%.  . Cardiomyopathy     a. 04/2013: EF 40% by cath.    Past Surgical History  Procedure Laterality Date  . Back surgery    . Cholecystectomy    . Hernia repair      bilateral inguinal hernia    Current Outpatient Prescriptions  Medication Sig Dispense Refill  . amiodarone (PACERONE) 200 MG tablet Take 0.5 tablets (100 mg total) by mouth daily.  90 tablet  1  . aspirin EC 81 MG tablet Take 81 mg by mouth daily.      Marland Kitchen. dextromethorphan-guaiFENesin (MUCINEX DM) 30-600 MG per 12 hr tablet Take 1 tablet by mouth 2 (two) times daily.  60 tablet  0  . FLUoxetine (PROZAC) 20 MG capsule Take 20 mg by mouth every morning.      . furosemide (LASIX) 40 MG tablet Take 1 tablet (40 mg total) by mouth 2 (two) times daily.  60 tablet  3  . glimepiride (AMARYL) 4 MG tablet Take 4 mg by mouth daily.      Marland Kitchen. levalbuterol (XOPENEX) 0.63 MG/3ML nebulizer solution Take 3 mLs (0.63 mg total) by nebulization 3 (three) times daily. And q4hours as needed for SOB or wheezing  3 mL  12  . metFORMIN (GLUCOPHAGE) 1000 MG tablet Take 1,000 mg by mouth daily with breakfast.       . ramipril (ALTACE) 10 MG capsule Take 10 mg by mouth daily.      . simvastatin (ZOCOR) 10 MG  tablet Take 1 tablet (10 mg total) by mouth at bedtime.  30 tablet  3  . tamsulosin (FLOMAX) 0.4 MG CAPS capsule Take 0.4 mg by mouth every morning.        No current facility-administered medications for this visit.    No Known Allergies  Review of Systems negative except from HPI and PMH  Physical Exam BP 151/68  Pulse 80  Ht 5\' 8"  (1.727 m)  Wt 195 lb (88.451 kg)  BMI 29.66 kg/m2 Well developed and well nourished in no acute distress HENT normal E scleral and icterus clear Neck Supple JVP flat; carotids brisk and full Clear to ausculation  Regular rate and rhythm, no murmurs gallops or rub Soft with active bowel sounds No clubbing cyanosis  Edema Alert and oriented, grossly normal motor and sensory function Skin Warm and Dry  Holter monitor demonstrates 0.3% PVCs. Mean heart rate is 49. Maximum heart rate is 73.  Assessment and  Plan  PVCs  Sinus bradycardia  TSH-low  Hypertension  There is significant suppression of his ventricular ectopy. This may be the cause of sinus bradycardia. We'll continue his amiodarone at 100 mg a day and  check a surveillance laboratories. We'll have him come back in 3 months time; we'll repeat the Holter monitor. He may need to undertake stress testing for chronotropic assessment although his symptoms right now are minimal.  Blood pressure is modestly elevated. Right now we will continue him simply on his   ramipril diuretics

## 2014-06-27 LAB — TSH: TSH: 1.1 u[IU]/mL (ref 0.35–4.50)

## 2014-09-25 ENCOUNTER — Encounter: Payer: Self-pay | Admitting: *Deleted

## 2014-09-25 ENCOUNTER — Encounter (INDEPENDENT_AMBULATORY_CARE_PROVIDER_SITE_OTHER): Payer: Medicare Other

## 2014-09-25 DIAGNOSIS — I493 Ventricular premature depolarization: Secondary | ICD-10-CM

## 2014-09-25 DIAGNOSIS — I428 Other cardiomyopathies: Secondary | ICD-10-CM

## 2014-09-25 DIAGNOSIS — I429 Cardiomyopathy, unspecified: Secondary | ICD-10-CM

## 2014-09-25 NOTE — Progress Notes (Signed)
Patient ID: Seth Hunt, male   DOB: July 04, 1933, 80 y.o.   MRN: [id number] [facility] 24 hour holter monitor applied to patient.

## 2014-10-04 ENCOUNTER — Encounter: Payer: Self-pay | Admitting: Internal Medicine

## 2014-10-04 ENCOUNTER — Ambulatory Visit (INDEPENDENT_AMBULATORY_CARE_PROVIDER_SITE_OTHER): Payer: Medicare Other | Admitting: Internal Medicine

## 2014-10-04 VITALS — BP 114/42 | HR 59 | Ht 68.0 in | Wt 192.0 lb

## 2014-10-04 DIAGNOSIS — I493 Ventricular premature depolarization: Secondary | ICD-10-CM

## 2014-10-04 DIAGNOSIS — R001 Bradycardia, unspecified: Secondary | ICD-10-CM

## 2014-10-04 DIAGNOSIS — I10 Essential (primary) hypertension: Secondary | ICD-10-CM

## 2014-10-04 DIAGNOSIS — H539 Unspecified visual disturbance: Secondary | ICD-10-CM

## 2014-10-04 NOTE — Patient Instructions (Signed)
Follow up with your eye doctor as soon as possible.  Your physician has recommended you make the following change in your medication:  Hold you amiodarone until seen by your eye doctor  Your physician recommends that you have lab work: TSH/ liver function  Your physician wants you to follow-up in: 6 months with Dr. Graciela Husbands. You will receive a reminder letter in the mail two months in advance. If you don't receive a letter, please call our office to schedule the follow-up appointment.

## 2014-10-04 NOTE — Progress Notes (Signed)
f      Patient Care Team: [name], MD as PCP - General   HPI  Seth Hunt is a 80 y.o. male With frequent PVCs of multiple morphologies. We elected because of that his amiodarone to see if we can suppress them.  Holter monitoring demonstrated significant suppression of ventricular ectopy now down to 0.3%. He feels good. He denies cough. He does have some sensitivity. There is no nausea.  Past Medical History  Diagnosis Date  . COPD (chronic obstructive pulmonary disease)   . Diabetes mellitus without complication   . BPH (benign prostatic hyperplasia)   . CAD (coronary artery disease)     a. 04/2013 - abnl stress echo with possible fall in EF. f/u cath - 50% mid LAD lesion, an anomalous patent circumflex and mild RCA lesions. EF 40%.  . Cardiomyopathy     a. 04/2013: EF 40% by cath.    Past Surgical History  Procedure Laterality Date  . Back surgery    . Cholecystectomy    . Hernia repair      bilateral inguinal hernia    Current Outpatient Prescriptions  Medication Sig Dispense Refill  . amiodarone (PACERONE) 200 MG tablet Take 0.5 tablets (100 mg total) by mouth daily.  90 tablet  1  . aspirin EC 81 MG tablet Take 81 mg by mouth daily.      Marland Kitchen FLUoxetine (PROZAC) 20 MG capsule Take 20 mg by mouth every morning.      . furosemide (LASIX) 40 MG tablet Take 1 tablet (40 mg total) by mouth 2 (two) times daily.  60 tablet  3  . glimepiride (AMARYL) 4 MG tablet Take 4 mg by mouth daily.      Marland Kitchen levalbuterol (XOPENEX) 0.63 MG/3ML nebulizer solution Take 3 mLs (0.63 mg total) by nebulization 3 (three) times daily. And q4hours as needed for SOB or wheezing  3 mL  12  . metFORMIN (GLUCOPHAGE) 1000 MG tablet Take 1,000 mg by mouth daily with breakfast.       . ramipril (ALTACE) 10 MG capsule Take 10 mg by mouth daily.      . simvastatin (ZOCOR) 10 MG tablet Take 1 tablet (10 mg total) by mouth at bedtime.  30 tablet  3  . tamsulosin (FLOMAX) 0.4 MG CAPS capsule Take 0.4 mg by  mouth every morning.        No current facility-administered medications for this visit.    No Known Allergies  Review of Systems negative except from HPI and PMH  Physical Exam BP 114/42  Pulse 59  Ht 5\' 8"  (1.727 m)  Wt 192 lb (87.091 kg)  BMI 29.20 kg/m2 Well developed and well nourished in no acute distress HENT normal E scleral and icterus clear Neck Supple JVP flat; carotids brisk and full Clear to ausculation  Regular rate and rhythm, no murmurs gallops or rub Soft with active bowel sounds No clubbing cyanosis  Edema Alert and oriented, grossly normal motor and sensory function Skin Warm and Dry  Holter monitor demonstrates   5% PVCs; mean heart rate is 61 and ranges 44-93 Assessment and  Plan  PVCs  Sinus bradycardia  TSH-low  Hypertension  Visual difficulties-new    There is significant suppression of his ventricular ectopy. This may be the cause of sinus bradycardia. But this seems to be asymptomatic. His visual symptoms are concerning. 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 Will hold his amiodarone until he has seen ophthalmology  to exclude optic neuropathy.  We will check amiodarone surveillance laboratories.  Blood pressure is well-controlled

## 2014-10-05 LAB — HEPATIC FUNCTION PANEL
ALT: 14 U/L (ref 0–53)
AST: 18 U/L (ref 0–37)
Albumin: 3.4 g/dL — ABNORMAL LOW (ref 3.5–5.2)
Alkaline Phosphatase: 56 U/L (ref 39–117)
BILIRUBIN DIRECT: 0.1 mg/dL (ref 0.0–0.3)
BILIRUBIN TOTAL: 0.5 mg/dL (ref 0.2–1.2)
Total Protein: 7.4 g/dL (ref 6.0–8.3)

## 2014-10-05 LAB — TSH: TSH: 0.92 u[IU]/mL (ref 0.35–4.50)

## 2014-10-24 ENCOUNTER — Telehealth: Payer: Self-pay | Admitting: Internal Medicine

## 2014-10-24 NOTE — Telephone Encounter (Signed)
Inquired if pt saw eye doctor 10/30, she confirms he did, and then put pt on phone to discuss what eye doctor said. Patient explained that he goes back to doctor on Monday to work on his left eye implant. Then he goes to see retina specialist 11/30. Asked him to have opthamologist send recent visit records to our office for review.  Informed him that we would wait until after retina visit for determining next step. He is agreeable to this plan.

## 2014-10-24 NOTE — Telephone Encounter (Signed)
New message            Daughter in law returning nurses call

## 2014-12-20 NOTE — Telephone Encounter (Signed)
Dr. Graciela Husbands reviewed eye doctor report - no optic neuropathy. Patient advised to restart Amiodarone. Patient verbalized understanding and agreeable to plan.

## 2015-02-03 IMAGING — CR DG CHEST 2V
2 series · 2 of 2 positions shown · non-contrast
Comparison: Portable chest x-rays December 23, 2012.

CLINICAL DATA: Dyspnea and cough and congestion.

EXAM:
CHEST  2 VIEW

[w chest pa]
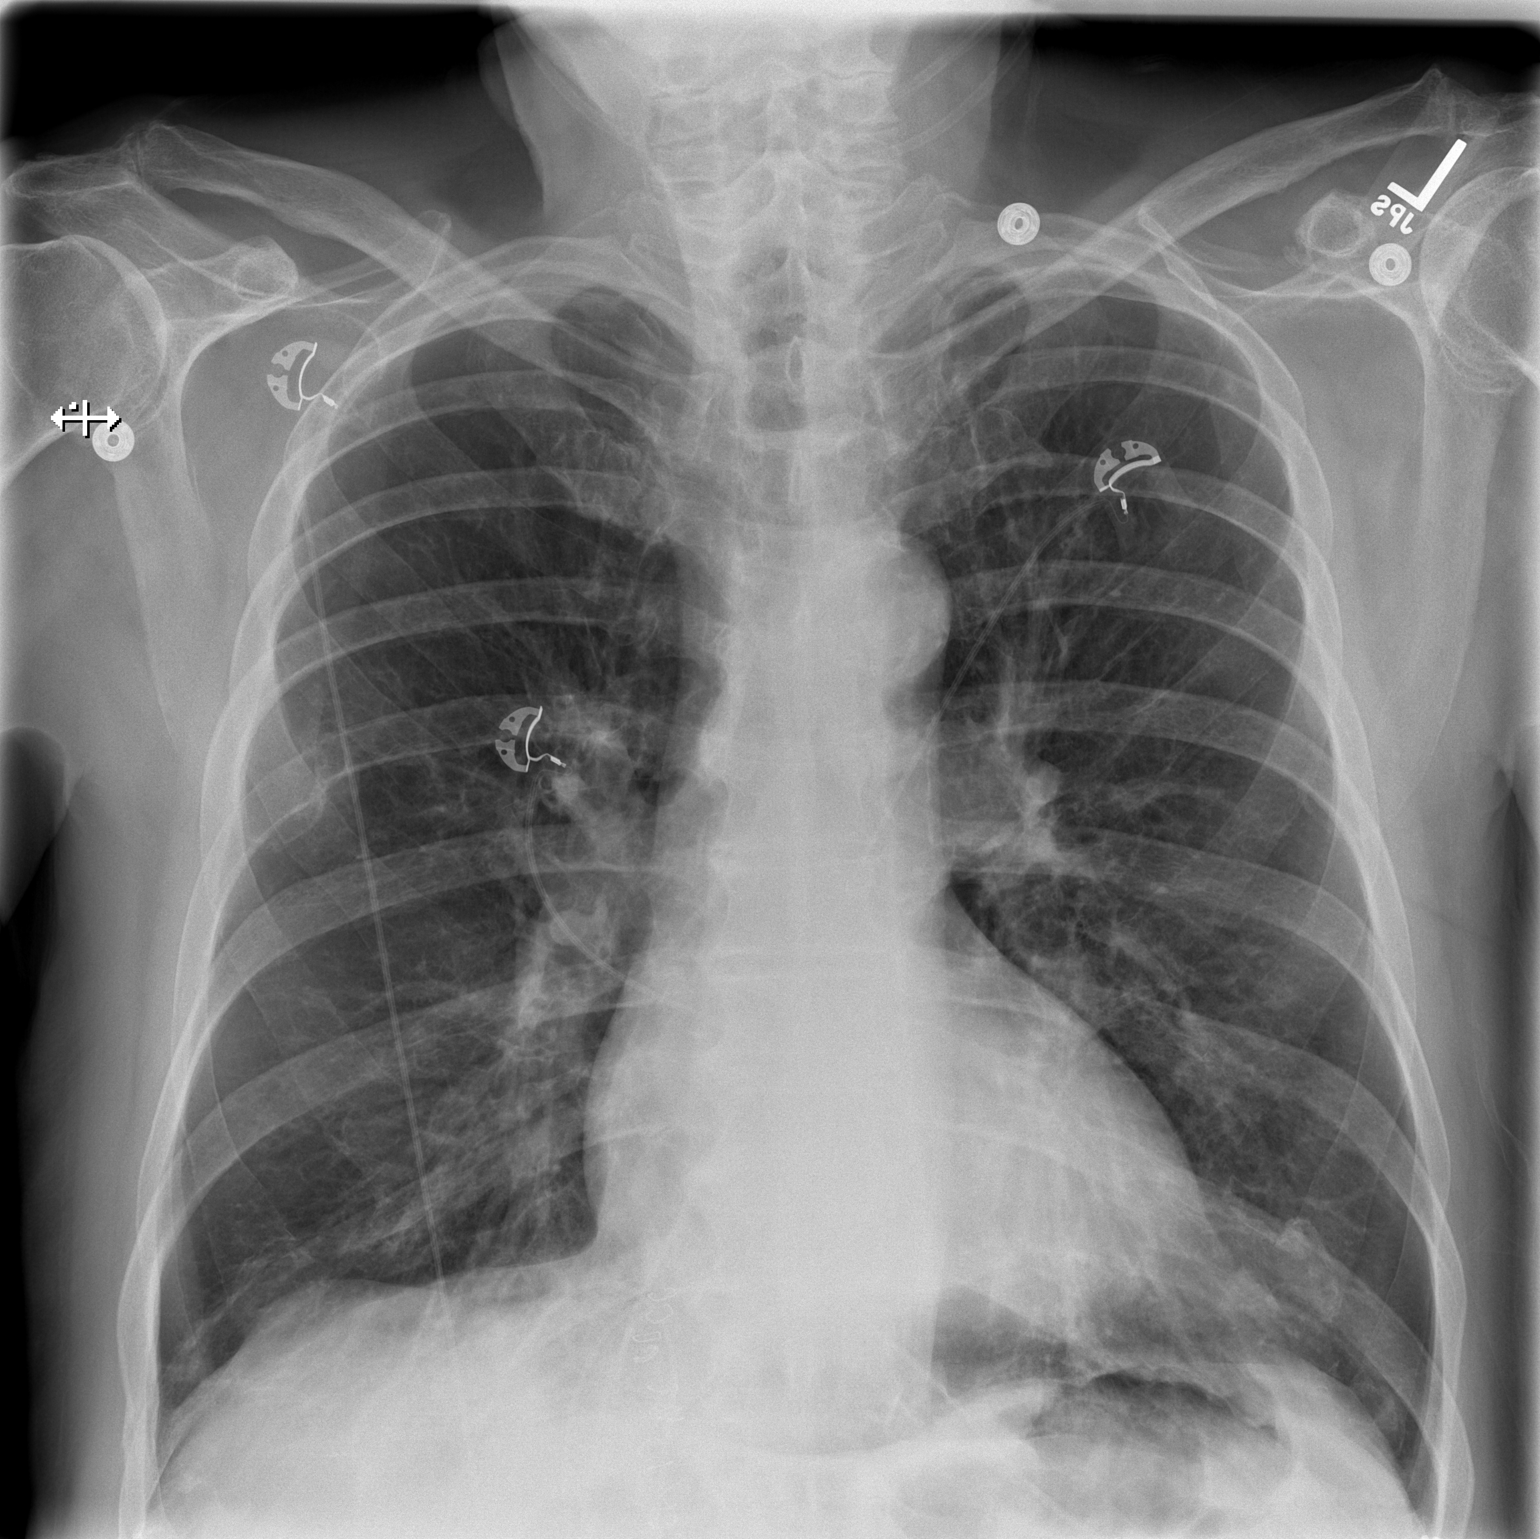

[w chest lat]
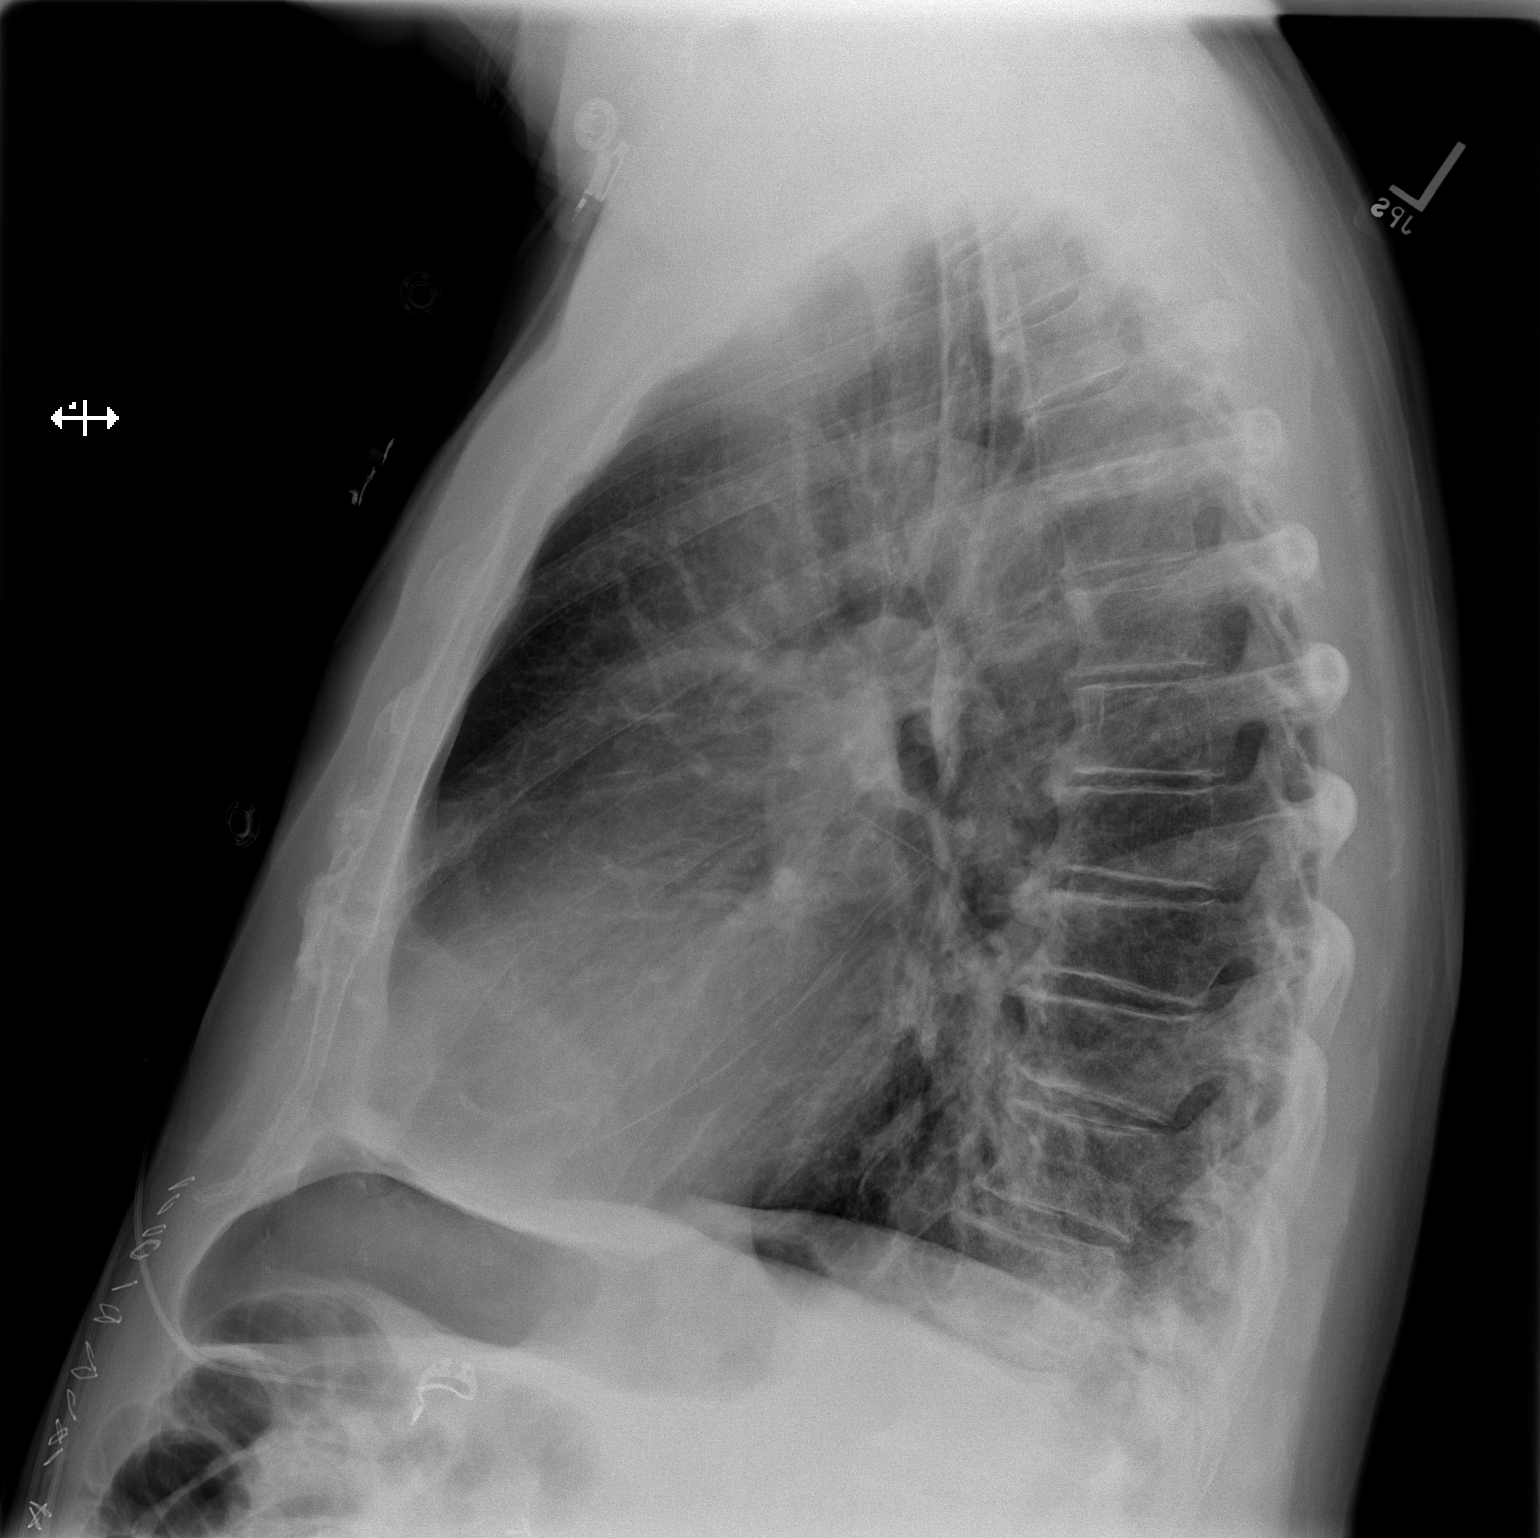

[2 of 2 positions shown; findings below may reference images not displayed]

FINDINGS: The lungs are mildly hyperinflated. There are coarse lung markings
in the retrocardiac region on the left and to a lesser extent in the
right infrahilar region which suggests atelectasis or early
pneumonia. The cardiopericardial silhouette is normal in size. The
pulmonary vascularity is not engorged. The mediastinum is normal in
width. There is no pleural effusion. The observed portions of the
bony thorax exhibit no acute abnormalities. There are surgical skin
staples over the upper abdomen.
IMPRESSION: The findings are consistent with atelectasis or early interstitial
pneumonia bilaterally in the lower lobes. This is superimposed upon
findings of C OPD. There is no evidence of CHF nor pleural effusion.

## 2015-02-08 IMAGING — CR DG CHEST 2V
2 series · 2 of 2 positions shown · non-contrast
Comparison: PA and lateral chest x-ray dated December 26, 2013.

CLINICAL DATA: Cough and congestion and flu-like symptoms.

EXAM:
CHEST  2 VIEW

[w chest pa]
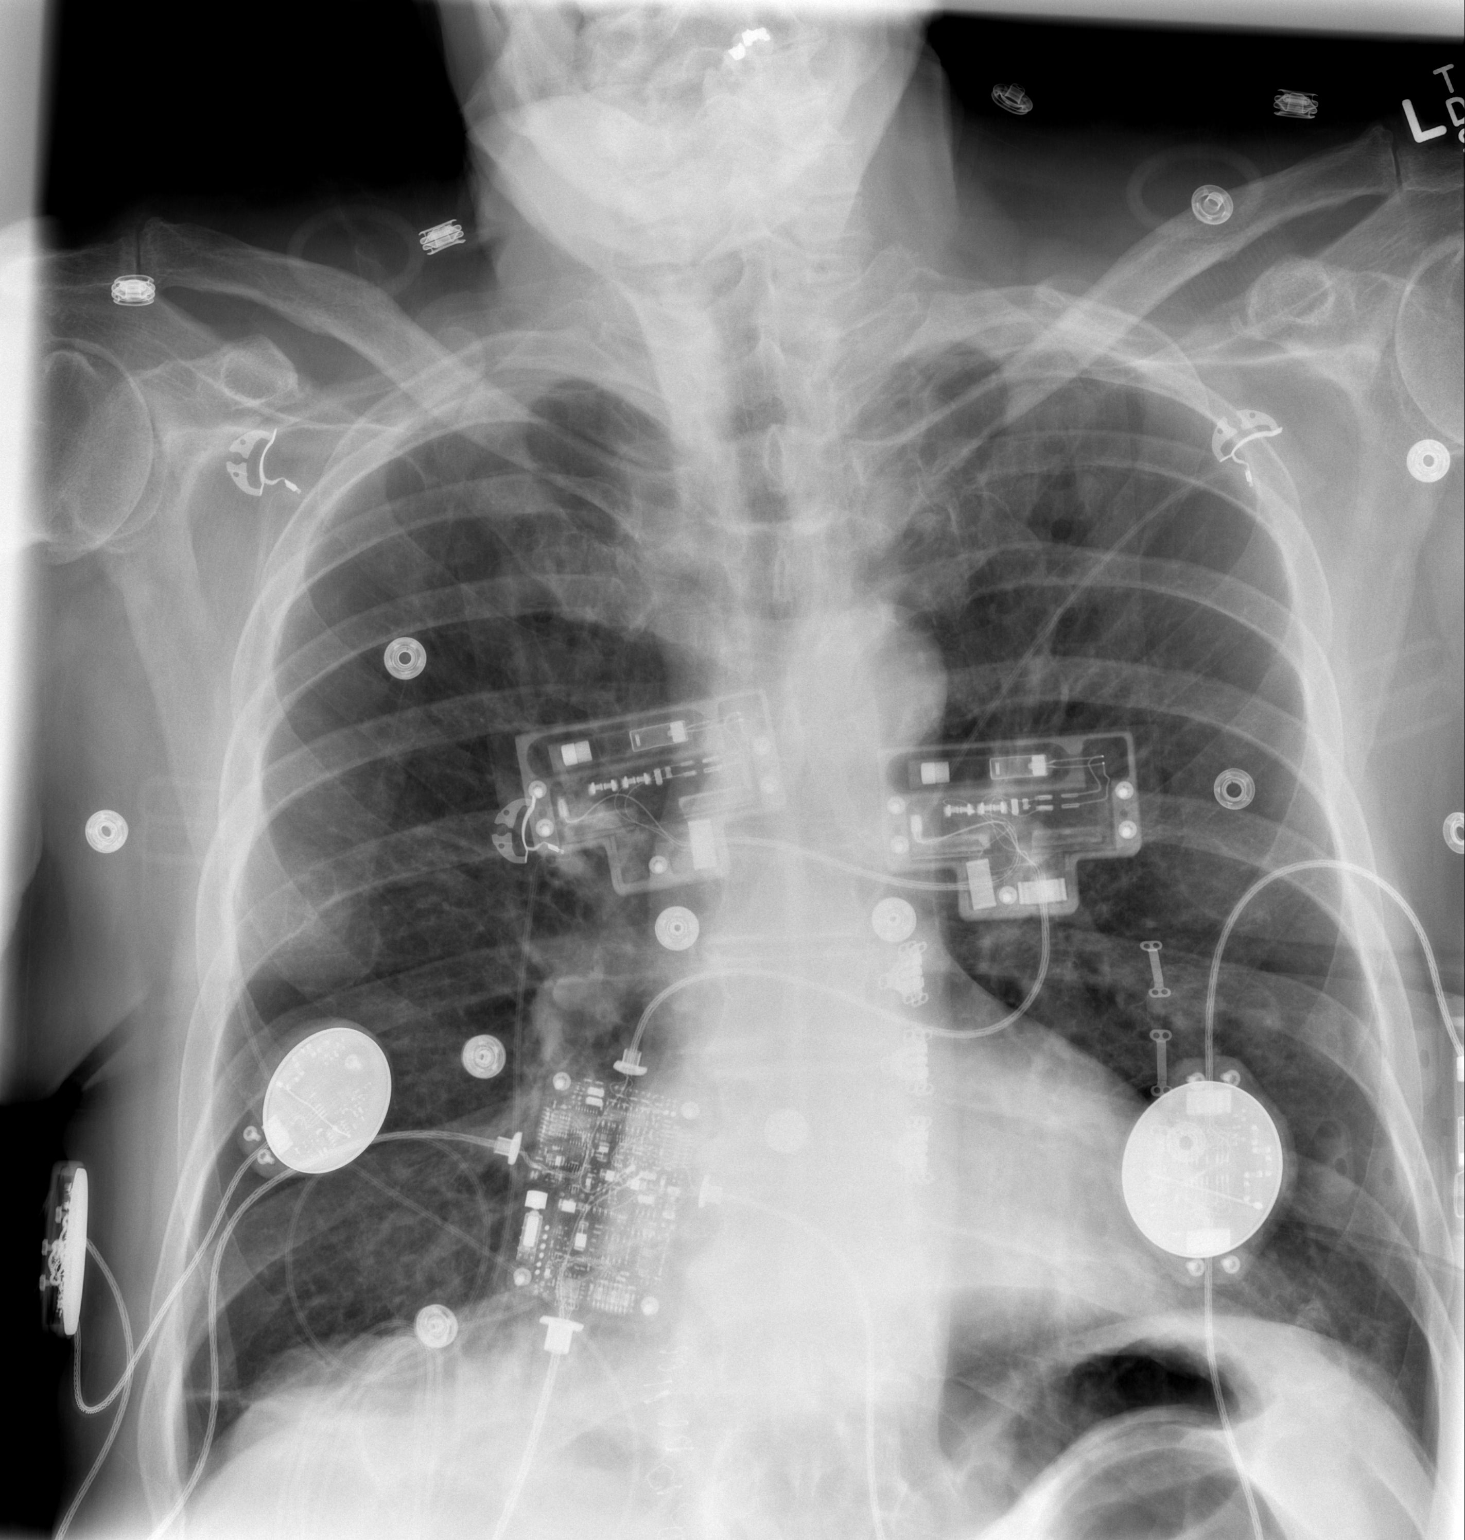

[w chest lat]
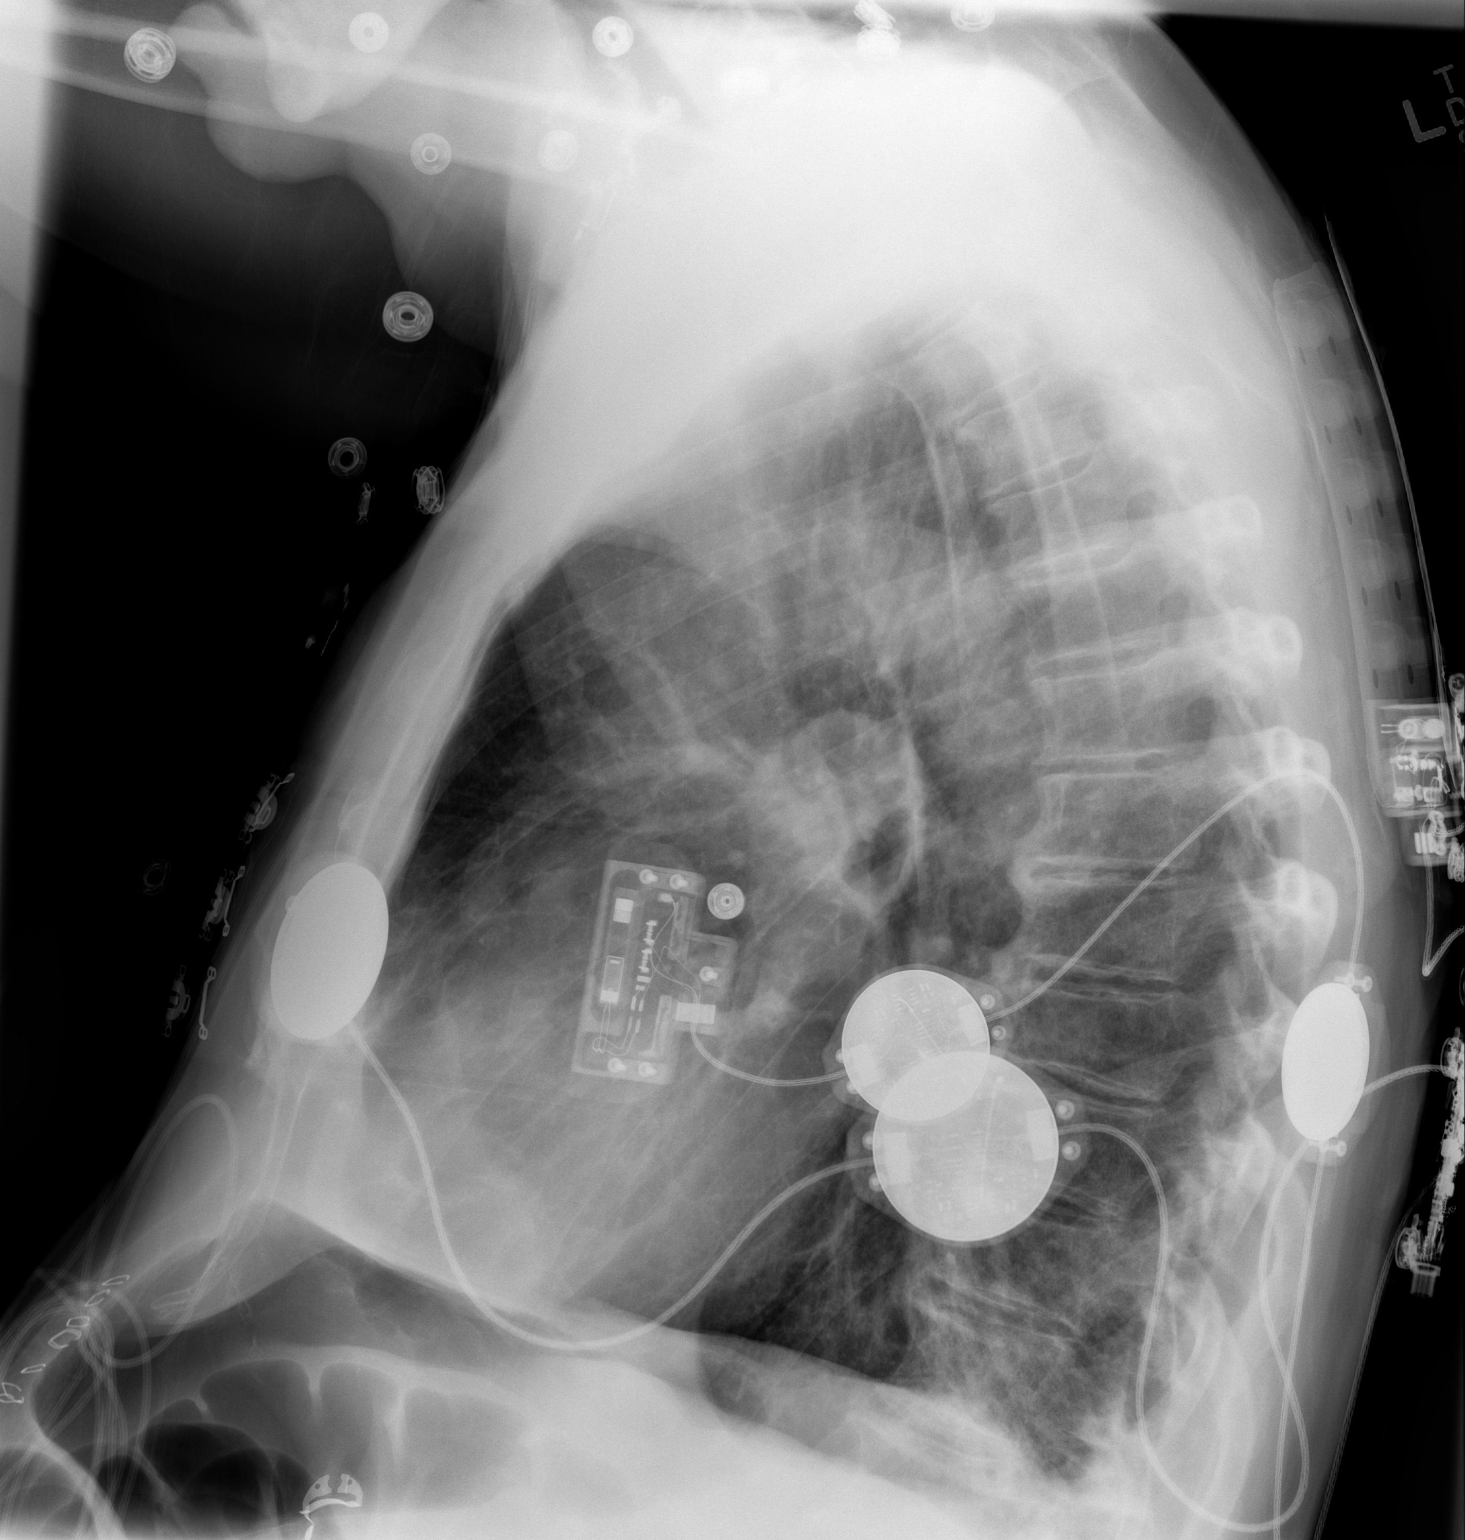

[2 of 2 positions shown; findings below may reference images not displayed]

FINDINGS: The lungs are adequately inflated. There is no focal infiltrate.
There remain coarse lung markings over the lower thoracic spine on
the lateral film. The cardiac silhouette is normal in size. The
pulmonary vascularity is not engorged. The frontal film correction
both the frontal and lateral films are limited due to overlying
electronic devices. The observed portions of the bony thorax exhibit
no acute abnormalities.
IMPRESSION: There is no evidence of pneumonia nor CHF. There is likely
underlying COPD.

## 2015-04-18 ENCOUNTER — Encounter: Payer: Self-pay | Admitting: Internal Medicine

## 2015-04-25 ENCOUNTER — Ambulatory Visit: Payer: Self-pay | Admitting: Internal Medicine

## 2015-05-06 ENCOUNTER — Encounter: Payer: Self-pay | Admitting: Internal Medicine

## 2015-05-06 NOTE — Progress Notes (Signed)
f      Patient Care Team: [name], MD as PCP - General   HPI  Seth Hunt is a 81 y.o. male With frequent PVCs of multiple morphologies. We elected because of that his amiodarone to see if we can suppress them.  Holter monitoring demonstrated significant suppression of ventricular ectopy now down to 0.3%. He feels good. He denies cough. He does have some sensitivity. There is no nausea.  Past Medical History  Diagnosis Date  . COPD (chronic obstructive pulmonary disease)   . Diabetes mellitus without complication   . BPH (benign prostatic hyperplasia)   . CAD (coronary artery disease)     a. 04/2013 - abnl stress echo with possible fall in EF. f/u cath - 50% mid LAD lesion, an anomalous patent circumflex and mild RCA lesions. EF 40%.  . Cardiomyopathy     a. 04/2013: EF 40% by cath.    Past Surgical History  Procedure Laterality Date  . Back surgery    . Cholecystectomy    . Hernia repair      bilateral inguinal hernia    Current Outpatient Prescriptions  Medication Sig Dispense Refill  . amiodarone (PACERONE) 200 MG tablet Take 0.5 tablets (100 mg total) by mouth daily. 90 tablet 1  . aspirin EC 81 MG tablet Take 81 mg by mouth daily.    Marland Kitchen FLUoxetine (PROZAC) 20 MG capsule Take 20 mg by mouth every morning.    . furosemide (LASIX) 40 MG tablet Take 1 tablet (40 mg total) by mouth 2 (two) times daily. 60 tablet 3  . glimepiride (AMARYL) 4 MG tablet Take 4 mg by mouth daily.    Marland Kitchen levalbuterol (XOPENEX) 0.63 MG/3ML nebulizer solution Take 3 mLs (0.63 mg total) by nebulization 3 (three) times daily. And q4hours as needed for SOB or wheezing 3 mL 12  . metFORMIN (GLUCOPHAGE) 1000 MG tablet Take 1,000 mg by mouth daily with breakfast.     . ramipril (ALTACE) 10 MG capsule Take 10 mg by mouth daily.    . simvastatin (ZOCOR) 10 MG tablet Take 1 tablet (10 mg total) by mouth at bedtime. 30 tablet 3  . tamsulosin (FLOMAX) 0.4 MG CAPS capsule Take 0.4 mg by mouth every morning.       No current facility-administered medications for this visit.    No Known Allergies  Review of Systems negative except from HPI and PMH  Physical Exam There were no vitals taken for this visit. Well developed and well nourished in no acute distress HENT normal E scleral and icterus clear Neck Supple JVP flat; carotids brisk and full Clear to ausculation  Regular rate and rhythm, no murmurs gallops or rub Soft with active bowel sounds No clubbing cyanosis  Edema Alert and oriented, grossly normal motor and sensory function Skin Warm and Dry  Holter monitor demonstrates   5% PVCs; mean heart rate is 61 and ranges 44-93 Assessment and  Plan  PVCs  Sinus bradycardia  TSH-low  Hypertension  Visual difficulties-new    There is significant suppression of his ventricular ectopy. This may be the cause of sinus bradycardia. But this seems to be asymptomatic. His visual symptoms are concerning. 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 Will hold his amiodarone until he has seen ophthalmology to exclude optic neuropathy.  We will check amiodarone surveillance laboratories.  Blood pressure is well-controlled

## 2015-05-17 ENCOUNTER — Encounter: Payer: Self-pay | Admitting: Internal Medicine

## 2015-06-10 ENCOUNTER — Other Ambulatory Visit: Payer: Self-pay | Admitting: *Deleted

## 2015-06-10 MED ORDER — AMIODARONE HCL 200 MG PO TABS: 100.0000 mg | ORAL_TABLET | Freq: Every day | ORAL | Status: DC

## 2015-09-02 ENCOUNTER — Ambulatory Visit (INDEPENDENT_AMBULATORY_CARE_PROVIDER_SITE_OTHER): Payer: Medicare Other | Admitting: Internal Medicine

## 2015-09-02 ENCOUNTER — Encounter: Payer: Self-pay | Admitting: Internal Medicine

## 2015-09-02 VITALS — BP 132/70 | HR 66 | Ht 68.0 in | Wt 192.0 lb

## 2015-09-02 DIAGNOSIS — I493 Ventricular premature depolarization: Secondary | ICD-10-CM

## 2015-09-02 DIAGNOSIS — I429 Cardiomyopathy, unspecified: Secondary | ICD-10-CM

## 2015-09-02 MED ORDER — LOSARTAN POTASSIUM 25 MG PO TABS: 25.0000 mg | ORAL_TABLET | Freq: Every day | ORAL | Status: AC

## 2015-09-02 NOTE — Patient Instructions (Addendum)
Medication Instructions: - Stop Altace (ramipril) - Start Cozaar (losartan) 25 mg one tablet by mouth once daily  Labwork: - none  Procedures/Testing: - none  Follow-Up: - Your physician wants you to follow-up in: 6 months with Gypsy Balsam, NP for Dr. Graciela Husbands. You will receive a reminder letter in the mail two months in advance. If you don't receive a letter, please call our office to schedule the follow-up appointment.  Any Additional Special Instructions Will Be Listed Below (If Applicable).

## 2015-09-02 NOTE — Progress Notes (Signed)
f      Patient Care Team: [name], MD as PCP - General   HPI  Seth Hunt is a 81 y.o. male With frequent PVCs of multiple morphologies. We elected because of that his amiodarone to see if we can suppress them.  At his last visit he had developed visual symptoms. Amiodarone was held while he went to see his eye doctor; it was resumed.  Holter monitoring demonstrated significant suppression of ventricular ectopy now down to 0.3%. He feels good. He has some cough   He does have sunsensitivity. There is no nausea.  Remotely he had cough with his smoking. His cough is now nonproductive.  Past Medical History  Diagnosis Date  . COPD (chronic obstructive pulmonary disease)   . Diabetes mellitus without complication   . BPH (benign prostatic hyperplasia)   . CAD (coronary artery disease)     a. 04/2013 - abnl stress echo with possible fall in EF. f/u cath - 50% mid LAD lesion, an anomalous patent circumflex and mild RCA lesions. EF 40%.  . Cardiomyopathy     a. 04/2013: EF 40% by cath.    Past Surgical History  Procedure Laterality Date  . Back surgery    . Cholecystectomy    . Hernia repair      bilateral inguinal hernia    Current Outpatient Prescriptions  Medication Sig Dispense Refill  . amiodarone (PACERONE) 200 MG tablet Take 0.5 tablets (100 mg total) by mouth daily. 45 tablet 0  . aspirin EC 81 MG tablet Take 81 mg by mouth daily.    Marland Kitchen FLUoxetine (PROZAC) 20 MG capsule Take 20 mg by mouth every morning.    . furosemide (LASIX) 40 MG tablet Take 1 tablet (40 mg total) by mouth 2 (two) times daily. 60 tablet 3  . glimepiride (AMARYL) 4 MG tablet Take 4 mg by mouth daily.    Marland Kitchen levalbuterol (XOPENEX) 0.63 MG/3ML nebulizer solution Take 3 mLs (0.63 mg total) by nebulization 3 (three) times daily. And q4hours as needed for SOB or wheezing 3 mL 12  . metFORMIN (GLUCOPHAGE) 1000 MG tablet Take 1,000 mg by mouth daily with breakfast.     . ramipril (ALTACE) 10 MG capsule  Take 10 mg by mouth daily.    . simvastatin (ZOCOR) 10 MG tablet Take 1 tablet (10 mg total) by mouth at bedtime. 30 tablet 3  . tamsulosin (FLOMAX) 0.4 MG CAPS capsule Take 0.4 mg by mouth every morning.      No current facility-administered medications for this visit.    No Known Allergies  Review of Systems negative except from HPI and PMH  Physical Exam BP 132/70 mmHg  Pulse 66  Ht 5\' 8"  (1.727 m)  Wt 192 lb (87.091 kg)  BMI 29.20 kg/m2 Well developed and well nourished in no acute distress HENT normal E scleral and icterus clear Neck Supple JVP flat; carotids brisk and full Clear to ausculation  Regular rate and rhythm, no murmurs gallops or rub Soft with active bowel sounds No clubbing cyanosis  Edema Alert and oriented, grossly normal motor and sensory function Skin Warm and Dry  ECG demonstrated sinus rhythm at 66 Intervals 22/15/47 neck 5 right bundle branch block PVC inferior axis morphology  Assessment and  Plan  PVCs  Sinus bradycardia  Hypertension  Cough  High risk medication surveillance  The patient has a nonproductive cough. This could be related to amiodarone but I suspect is more likely related to his ACE inhibitor.  We will discontinue the ACE inhibitor a change him losartan 25 mg daily. He is followed closely by his PCP with blood work every 3 months. He will try and get [place] copies of his most recent blood work.  This will be important in terms of amiodarone surveillance

## 2016-03-13 ENCOUNTER — Ambulatory Visit (INDEPENDENT_AMBULATORY_CARE_PROVIDER_SITE_OTHER): Payer: Medicare Other | Admitting: Nurse Practitioner

## 2016-03-13 ENCOUNTER — Encounter: Payer: Self-pay | Admitting: Nurse Practitioner

## 2016-03-13 VITALS — BP 142/62 | HR 60 | Ht 68.0 in | Wt 188.4 lb

## 2016-03-13 DIAGNOSIS — I10 Essential (primary) hypertension: Secondary | ICD-10-CM

## 2016-03-13 DIAGNOSIS — I493 Ventricular premature depolarization: Secondary | ICD-10-CM

## 2016-03-13 NOTE — Progress Notes (Signed)
Electrophysiology Office Note Date: 03/13/2016  ID:  Seth Hunt, DOB [birth year], MRN [id number]  PCP: Angelique Blonder, MD Electrophysiologist: Graciela Husbands  CC: PVC follow up  Seth Hunt is a 82 y.o. male seen today for Dr Graciela Husbands.  He presents today for routine electrophysiology followup.  Since last being seen in our clinic, the patient reports doing very well.  He has stable shortness of breath on exertion but denies chest pain, palpitations, PND, orthopnea, nausea, vomiting, dizziness, syncope, edema, weight gain, or early satiety.  Past Medical History  Diagnosis Date  . COPD (chronic obstructive pulmonary disease)   . Diabetes mellitus without complication   . BPH (benign prostatic hyperplasia)   . CAD (coronary artery disease)     a. 04/2013 - abnl stress echo with possible fall in EF. f/u cath - 50% mid LAD lesion, an anomalous patent circumflex and mild RCA lesions. EF 40%.  . Cardiomyopathy     a. 04/2013: EF 40% by cath.   Past Surgical History  Procedure Laterality Date  . Back surgery    . Cholecystectomy    . Hernia repair      bilateral inguinal hernia    Current Outpatient Prescriptions  Medication Sig Dispense Refill  . amiodarone (PACERONE) 200 MG tablet Take 0.5 tablets (100 mg total) by mouth daily. 45 tablet 0  . aspirin EC 81 MG tablet Take 81 mg by mouth daily.    Marland Kitchen FLUoxetine (PROZAC) 20 MG capsule Take 20 mg by mouth every morning.    . furosemide (LASIX) 40 MG tablet Take 1 tablet (40 mg total) by mouth 2 (two) times daily. 60 tablet 3  . glimepiride (AMARYL) 4 MG tablet Take 4 mg by mouth daily.    Marland Kitchen levalbuterol (XOPENEX) 0.63 MG/3ML nebulizer solution Take 3 mLs (0.63 mg total) by nebulization 3 (three) times daily. And q4hours as needed for SOB or wheezing 3 mL 12  . losartan (COZAAR) 25 MG tablet Take 1 tablet (25 mg total) by mouth daily. 30 tablet 11  . metFORMIN (GLUCOPHAGE) 1000 MG tablet Take 1,000 mg by mouth daily with breakfast.     .  simvastatin (ZOCOR) 10 MG tablet Take 1 tablet (10 mg total) by mouth at bedtime. 30 tablet 3  . tamsulosin (FLOMAX) 0.4 MG CAPS capsule Take 0.4 mg by mouth every morning.      No current facility-administered medications for this visit.    Allergies:   Review of patient's allergies indicates no known allergies.   Social History: Social History   Social History  . Marital Status: Widowed    Spouse Name: N/A  . Number of Children: 0  . Years of Education: N/A   Occupational History  . Retired      Press photographer   Social History Main Topics  . Smoking status: Former Smoker -- 1.00 packs/day for 20 years    Types: Cigarettes  . Smokeless tobacco: Not on file     Comment: Quit 20 years ago  . Alcohol Use: No  . Drug Use: No  . Sexual Activity: Not on file   Other Topics Concern  . Not on file   Social History Narrative   Lives with one stepchild.  He has three other stepchildren.     Family History: Family History  Problem Relation Age of Onset  . CAD Father 71  . CAD Mother 52    Lived until age 73  . Cancer Sister   . Heart attack  Brother   . Stroke Brother   . Diabetes Brother     Review of Systems: All other systems reviewed and are otherwise negative except as noted above.   Physical Exam: VS:  There were no vitals taken for this visit. , BMI There is no weight on file to calculate BMI. Wt Readings from Last 3 Encounters:  09/02/15 192 lb (87.091 kg)  10/04/14 192 lb (87.091 kg)  06/26/14 195 lb (88.451 kg)    GEN- The patient is elderly appearing, alert and oriented x 3 today.   HEENT: normocephalic, atraumatic; sclera clear, conjunctiva pink; hearing intact; oropharynx clear; neck supple  Lungs- Clear to ausculation bilaterally, normal work of breathing.  No wheezes, rales, rhonchi Heart- Regular rate and rhythm  GI- soft, non-tender, non-distended, bowel sounds present  Extremities- no clubbing, cyanosis, or edema; DP/PT/radial pulses 2+  bilaterally MS- no significant deformity or atrophy Skin- warm and dry, no rash or lesion  Psych- euthymic mood, full affect Neuro- strength and sensation are intact   EKG:  EKG is ordered today. The ekg ordered today shows sinus rhythm with 1st degree AV block, rate 63, RBBB  Recent Labs: No results found for requested labs within last 365 days.    Other studies Reviewed: Additional studies/ records that were reviewed today include: Dr Odessa Fleming office notes  Assessment and Plan: 1.  PVC's  Controlled on low dose amiodarone TSH, LFT's today, annual eye exams recommended  2.  HTN Stable No change required today   Current medicines are reviewed at length with the patient today.   The patient does not have concerns regarding his medicines.  The following changes were made today:  none  Labs/ tests ordered today include: TSH, LFTs   Disposition:   Follow up with Dr Graciela Husbands in 1 year     Signed, Gypsy Balsam, NP 03/13/2016 7:57 AM   [facility] [address] [place] [address] [phone] (office) [phone] (fax)

## 2016-03-13 NOTE — Addendum Note (Signed)
Addended by: Oleta Mouse on: 03/13/2016 01:47 PM   Modules accepted: Orders

## 2016-03-13 NOTE — Patient Instructions (Signed)
Medication Instructions:   Your physician recommends that you continue on your current medications as directed. Please refer to the Current Medication list given to you today.   If you need a refill on your cardiac medications before your next appointment, please call your pharmacy.  Labwork:   LFT AND TSH TODAY   Testing/Procedures:  NONE ORDER TODAY   Follow-Up:  Your physician wants you to follow-up in: ONE YEAR WITH KLEIN ill receive a reminder letter in the mail two months in advance. If you don't receive a letter, please call our office to schedule the follow-up appointment.     Any Other Special Instructions Will Be Listed Below (If Applicable).

## 2016-06-29 ENCOUNTER — Other Ambulatory Visit: Payer: Self-pay

## 2016-06-29 MED ORDER — AMIODARONE HCL 200 MG PO TABS: 100.0000 mg | ORAL_TABLET | Freq: Every day | ORAL | Status: AC

## 2016-06-29 NOTE — Telephone Encounter (Signed)
Marily Lente, NP at 03/13/2016 7:57 AM  amiodarone (PACERONE) 200 MG tabletTake 0.5 tablets (100 mg total) by mouth daily Assessment and Plan: 1. PVC's  Controlled on low dose amiodarone TSH, LFT's today, annual eye exams recommended  Patient Instructions     Medication Instructions:   Your physician recommends that you continue on your current medications as directed. Please refer to the Current Medication list given to you today   Notes Recorded by Duke Salvia, MD on 10/09/2014 at 5:24 PM Please Inform Patient that labs are normal

## 2016-09-10 NOTE — Progress Notes (Signed)
This encounter was created in error - please disregard.

## 2017-03-02 ENCOUNTER — Encounter: Payer: Self-pay | Admitting: Internal Medicine

## 2017-03-11 ENCOUNTER — Encounter (INDEPENDENT_AMBULATORY_CARE_PROVIDER_SITE_OTHER): Payer: Self-pay

## 2017-03-11 ENCOUNTER — Encounter: Payer: Self-pay | Admitting: Internal Medicine

## 2017-03-11 ENCOUNTER — Ambulatory Visit (INDEPENDENT_AMBULATORY_CARE_PROVIDER_SITE_OTHER): Payer: Medicare Other | Admitting: Internal Medicine

## 2017-03-11 VITALS — BP 140/62 | HR 79 | Ht 68.0 in | Wt 185.0 lb

## 2017-03-11 DIAGNOSIS — R0600 Dyspnea, unspecified: Secondary | ICD-10-CM

## 2017-03-11 DIAGNOSIS — Z79899 Other long term (current) drug therapy: Secondary | ICD-10-CM | POA: Diagnosis not present

## 2017-03-11 DIAGNOSIS — I493 Ventricular premature depolarization: Secondary | ICD-10-CM

## 2017-03-11 DIAGNOSIS — R001 Bradycardia, unspecified: Secondary | ICD-10-CM

## 2017-03-11 NOTE — Patient Instructions (Addendum)
Medication Instructions: - Your physician recommends that you continue on your current medications as directed. Please refer to the Current Medication list given to you today.  Labwork: - Your physician recommends that you have lab work today: liver/tsh/bnp  Procedures/Testing: - none ordered  Follow-Up: - Your physician wants you to follow-up in: 6 months with Dr. Graciela Husbands. You will receive a reminder letter in the mail two months in advance. If you don't receive a letter, please call our office to schedule the follow-up appointment.  Any Additional Special Instructions Will Be Listed Below (If Applicable).     If you need a refill on your cardiac medications before your next appointment, please call your pharmacy.

## 2017-03-11 NOTE — Progress Notes (Signed)
f      Patient Care Team: Angelique Blonder, MD as PCP - General   HPI  Seth Hunt is a 83 y.o. male With frequent PVCs of multiple morphologies we used amiodarone.  Holter monitoring demonstrated significant suppression of ventricular ectopy now down to 0.3%  He had developed visual symptoms. Amiodarone was held while he went to see his eye doctor; subsequently resumed.   He feels good. He has some cough   He does have sunsensitivity. There is no nausea.  He is unaware of palpitations  He continues with shortness of breath which is chronic and unchanged. He denies chest pain. He is not having edema   3/15 Echo 35%     Past Medical History:  Diagnosis Date  . BPH (benign prostatic hyperplasia)   . CAD (coronary artery disease)    a. 04/2013 - abnl stress echo with possible fall in EF. f/u cath - 50% mid LAD lesion, an anomalous patent circumflex and mild RCA lesions. EF 40%.  . Cardiomyopathy (HCC)    a. 04/2013: EF 40% by cath.  Marland Kitchen COPD (chronic obstructive pulmonary disease) (HCC)   . Diabetes mellitus without complication [facility])     Past Surgical History:  Procedure Laterality Date  . BACK SURGERY    . CHOLECYSTECTOMY    . HERNIA REPAIR     bilateral inguinal hernia    Current Outpatient Prescriptions  Medication Sig Dispense Refill  . amiodarone (PACERONE) 200 MG tablet Take 0.5 tablets (100 mg total) by mouth daily. 45 tablet 0  . aspirin EC 81 MG tablet Take 81 mg by mouth daily.    Marland Kitchen FLUoxetine (PROZAC) 20 MG capsule Take 20 mg by mouth every morning.    . furosemide (LASIX) 40 MG tablet Take 1 tablet (40 mg total) by mouth 2 (two) times daily. 60 tablet 3  . glimepiride (AMARYL) 4 MG tablet Take 4 mg by mouth daily.    Marland Kitchen levalbuterol (XOPENEX) 0.63 MG/3ML nebulizer solution Take 3 mLs (0.63 mg total) by nebulization 3 (three) times daily. And q4hours as needed for SOB or wheezing 3 mL 12  . losartan (COZAAR) 25 MG tablet Take 1 tablet (25 mg total) by mouth  daily. 30 tablet 11  . metFORMIN (GLUCOPHAGE) 1000 MG tablet Take 1,000 mg by mouth daily with breakfast.     . simvastatin (ZOCOR) 10 MG tablet Take 1 tablet (10 mg total) by mouth at bedtime. 30 tablet 3  . tamsulosin (FLOMAX) 0.4 MG CAPS capsule Take 0.4 mg by mouth every morning.      No current facility-administered medications for this visit.     No Known Allergies  Review of Systems negative except from HPI and PMH  Physical Exam BP 140/62   Pulse 79   Ht 5\' 8"  (1.727 m)   Wt 185 lb (83.9 kg)   SpO2 97%   BMI 28.13 kg/m  Well developed and well nourished in no acute distress HENT normal E scleral and icterus clear Neck Supple JVP flat; carotids brisk and full Clear to ausculation  Regular rate and rhythm, no murmurs gallops or rub Soft with active bowel sounds No clubbing cyanosis  Edema Alert and oriented, grossly normal motor and sensory function Skin Warm and Dry  ECG demonstrated sinus rhythm at 66 Intervals 24/15/44     right bundle branch block PVC inferior axis morphology freq  Assessment and  Plan  PVCs  Cardiomyopathy--nonischemic  Sinus bradycardia  Hypertension  Dyspnea on exertion  High risk medication surveillance    Continues to have PVCs with apparent increase in frequency. His functional status is stable.   We will plan to see him in 6 months. If his PVCs are still plentiful on his ECGs will need a Holter monitor and repeat echocardiogram. Functional status is currently stable .     we will check his amiodarone surveillance laboratories and given his dyspnea we will check a BNP    Bradycardia does not seem to be an issue

## 2017-03-12 ENCOUNTER — Ambulatory Visit (HOSPITAL_COMMUNITY)
Admission: RE | Admit: 2017-03-12 | Discharge: 2017-03-12 | Disposition: A | Discharge status: Home / Self Care | Payer: Medicare Other | Source: Ambulatory Visit | Attending: *Deleted | Admitting: *Deleted

## 2017-03-12 ENCOUNTER — Other Ambulatory Visit (HOSPITAL_COMMUNITY): Payer: Self-pay | Admitting: *Deleted

## 2017-03-12 DIAGNOSIS — M47814 Spondylosis without myelopathy or radiculopathy, thoracic region: Secondary | ICD-10-CM | POA: Diagnosis not present

## 2017-03-12 DIAGNOSIS — R05 Cough: Secondary | ICD-10-CM | POA: Diagnosis not present

## 2017-03-12 DIAGNOSIS — R059 Cough, unspecified: Secondary | ICD-10-CM

## 2017-03-12 DIAGNOSIS — I700 Atherosclerosis of aorta: Secondary | ICD-10-CM | POA: Insufficient documentation

## 2017-03-12 LAB — HEPATIC FUNCTION PANEL
ALBUMIN: 4.1 g/dL (ref 3.5–4.7)
ALT: 10 IU/L (ref 0–44)
AST: 12 IU/L (ref 0–40)
Alkaline Phosphatase: 72 IU/L (ref 39–117)
Bilirubin Total: 0.4 mg/dL (ref 0.0–1.2)
Bilirubin, Direct: 0.14 mg/dL (ref 0.00–0.40)
TOTAL PROTEIN: 6.6 g/dL (ref 6.0–8.5)

## 2017-03-12 LAB — TSH: TSH: 1.24 u[IU]/mL (ref 0.450–4.500)

## 2017-03-12 LAB — PRO B NATRIURETIC PEPTIDE: NT-Pro BNP: 783 pg/mL — ABNORMAL HIGH (ref 0–486)

## 2017-04-28 ENCOUNTER — Ambulatory Visit (INDEPENDENT_AMBULATORY_CARE_PROVIDER_SITE_OTHER): Payer: Medicare Other | Admitting: Internal Medicine

## 2017-04-28 ENCOUNTER — Encounter: Payer: Self-pay | Admitting: Internal Medicine

## 2017-04-28 VITALS — BP 126/64 | HR 70 | Ht 68.0 in | Wt 179.8 lb

## 2017-04-28 DIAGNOSIS — I493 Ventricular premature depolarization: Secondary | ICD-10-CM | POA: Diagnosis not present

## 2017-04-28 DIAGNOSIS — R001 Bradycardia, unspecified: Secondary | ICD-10-CM

## 2017-04-28 DIAGNOSIS — Z79899 Other long term (current) drug therapy: Secondary | ICD-10-CM | POA: Diagnosis not present

## 2017-04-28 DIAGNOSIS — I428 Other cardiomyopathies: Secondary | ICD-10-CM | POA: Diagnosis not present

## 2017-04-28 NOTE — Patient Instructions (Signed)
Medication Instructions: - Your physician recommends that you continue on your current medications as directed. Please refer to the Current Medication list given to you today.  Labwork: - Your physician recommends that you have lab work today: BMP/ BNP  Procedures/Testing: - none ordered  Follow-Up: - Your physician recommends that you schedule a follow-up appointment in: 4 months with Gypsy Balsam, NP for Dr. Graciela Husbands.   Any Additional Special Instructions Will Be Listed Below (If Applicable).     If you need a refill on your cardiac medications before your next appointment, please call your pharmacy.

## 2017-04-28 NOTE — Progress Notes (Signed)
f      Patient Care Team: Cordial, Launa Grill, MD as PCP - General   HPI  Seth Hunt is a 83 y.o. male With frequent PVCs of multiple morphologies we used amiodarone.  Holter monitoring demonstrated significant suppression of ventricular ectopy now down to 0.3% previously. Not recently reassessed  He had developed visual symptoms. Amiodarone was held while he went to see his eye doctor; subsequently resumed.   He feels good. He has some cough   He does have sunsensitivity. There is no nausea.  He is unaware of palpitations  He continues with shortness of breath which is chronic in his non-positional. It did not improve with the resumption of his diuretics. It has no associated chest pain. He is not having edema  BNP obtained because of shortness of breath at last visit was 783 which prompted the resumption of his diuretics as noted above   3/15 Echo 35%     Past Medical History:  Diagnosis Date  . BPH (benign prostatic hyperplasia)   . CAD (coronary artery disease)    a. 04/2013 - abnl stress echo with possible fall in EF. f/u cath - 50% mid LAD lesion, an anomalous patent circumflex and mild RCA lesions. EF 40%.  . Cardiomyopathy ([place])    a. 04/2013: EF 40% by cath.  Marland Kitchen COPD (chronic obstructive pulmonary disease) ([place])   . Diabetes mellitus without complication [facility])     Past Surgical History:  Procedure Laterality Date  . BACK SURGERY    . CHOLECYSTECTOMY    . HERNIA REPAIR     bilateral inguinal hernia    Current Outpatient Prescriptions  Medication Sig Dispense Refill  . amiodarone (PACERONE) 200 MG tablet Take 0.5 tablets (100 mg total) by mouth daily. 45 tablet 0  . aspirin EC 81 MG tablet Take 81 mg by mouth daily.    Marland Kitchen FLUoxetine (PROZAC) 20 MG capsule Take 20 mg by mouth every morning.    . furosemide (LASIX) 40 MG tablet Take 1 tablet (40 mg total) by mouth 2 (two) times daily. 60 tablet 3  . glimepiride (AMARYL) 4 MG tablet Take 4 mg by mouth daily.      Marland Kitchen losartan (COZAAR) 25 MG tablet Take 1 tablet (25 mg total) by mouth daily. 30 tablet 11  . metFORMIN (GLUCOPHAGE) 1000 MG tablet Take 1,000 mg by mouth daily with breakfast.     . simvastatin (ZOCOR) 10 MG tablet Take 1 tablet (10 mg total) by mouth at bedtime. 30 tablet 3  . tamsulosin (FLOMAX) 0.4 MG CAPS capsule Take 0.4 mg by mouth every morning.      No current facility-administered medications for this visit.     No Known Allergies  Review of Systems negative except from HPI and PMH  Physical Exam BP 126/64   Pulse 70   Ht 5' 8"  (1.727 m)   Wt 179 lb 12.8 oz (81.6 kg)   SpO2 98%   BMI 27.34 kg/m  Well developed and well nourished in no acute distress HENT normal E scleral and icterus clear Neck Supple JVP flat; carotids brisk and full Clear to ausculation  Regular rate and rhythm, no murmurs gallops or rub Soft with active bowel sounds No clubbing cyanosis  Edema Alert and oriented, grossly normal motor and sensory function Skin Warm and Dry  ECG demonstrated sinus rhythm at70 Intervals 23/17/46 right bundle branch block right axis deviation Infrequent PVCs    Assessment and  Plan  PVCs  Cardiomyopathy--nonischemic  Sinus bradycardia  Hypertension  Dyspnea on exertion   High risk medication surveillance    Breathing was not much improved on the diuretics which were resumed. We will check a BNP today as well as a met profile    I suspect that this might represent in part COPD;  on his low-dose amiodarone it is less likely this although it is his dyspnea does not improve over the next couple of months or worsens at all we will plan To stop it anyway  Bradycardia does not seem limiting  Blood pressure well controlled

## 2017-04-29 LAB — BASIC METABOLIC PANEL
BUN / CREAT RATIO: 19 (ref 10–24)
BUN: 29 mg/dL — AB (ref 8–27)
CALCIUM: 9.2 mg/dL (ref 8.6–10.2)
CHLORIDE: 99 mmol/L (ref 96–106)
CO2: 26 mmol/L (ref 18–29)
CREATININE: 1.54 mg/dL — AB (ref 0.76–1.27)
GFR calc Af Amer: 48 mL/min/{1.73_m2} — ABNORMAL LOW (ref >59)
GFR calc non Af Amer: 41 mL/min/{1.73_m2} — ABNORMAL LOW (ref >59)
GLUCOSE: 176 mg/dL — AB (ref 65–99)
Potassium: 4.7 mmol/L (ref 3.5–5.2)
Sodium: 142 mmol/L (ref 134–144)

## 2017-04-29 LAB — PRO B NATRIURETIC PEPTIDE: NT-PRO BNP: 395 pg/mL (ref 0–486)

## 2017-05-04 ENCOUNTER — Other Ambulatory Visit: Payer: Self-pay | Admitting: *Deleted

## 2017-05-04 DIAGNOSIS — N289 Disorder of kidney and ureter, unspecified: Secondary | ICD-10-CM

## 2017-05-04 DIAGNOSIS — I428 Other cardiomyopathies: Secondary | ICD-10-CM

## 2017-05-04 MED ORDER — FUROSEMIDE 40 MG PO TABS: 40.0000 mg | ORAL_TABLET | Freq: Every day | ORAL | Status: AC

## 2017-05-24 ENCOUNTER — Other Ambulatory Visit: Payer: Medicare Other

## 2017-05-26 ENCOUNTER — Other Ambulatory Visit: Payer: Medicare Other | Admitting: *Deleted

## 2017-05-26 DIAGNOSIS — N289 Disorder of kidney and ureter, unspecified: Secondary | ICD-10-CM

## 2017-05-26 DIAGNOSIS — I428 Other cardiomyopathies: Secondary | ICD-10-CM

## 2017-05-27 LAB — BASIC METABOLIC PANEL
BUN / CREAT RATIO: 18 (ref 10–24)
BUN: 25 mg/dL (ref 8–27)
CO2: 26 mmol/L (ref 20–29)
CREATININE: 1.39 mg/dL — AB (ref 0.76–1.27)
Calcium: 9.1 mg/dL (ref 8.6–10.2)
Chloride: 101 mmol/L (ref 96–106)
GFR calc Af Amer: 54 mL/min/{1.73_m2} — ABNORMAL LOW (ref >59)
GFR calc non Af Amer: 47 mL/min/{1.73_m2} — ABNORMAL LOW (ref >59)
GLUCOSE: 92 mg/dL (ref 65–99)
Potassium: 4.3 mmol/L (ref 3.5–5.2)
Sodium: 142 mmol/L (ref 134–144)

## 2017-10-25 ENCOUNTER — Encounter: Payer: Self-pay | Admitting: Physician Assistant

## 2017-11-08 ENCOUNTER — Ambulatory Visit: Payer: Medicare Other | Admitting: Physician Assistant

## 2017-12-20 ENCOUNTER — Other Ambulatory Visit (HOSPITAL_COMMUNITY): Payer: Self-pay | Admitting: *Deleted

## 2017-12-20 DIAGNOSIS — E041 Nontoxic single thyroid nodule: Secondary | ICD-10-CM

## 2017-12-21 ENCOUNTER — Encounter (INDEPENDENT_AMBULATORY_CARE_PROVIDER_SITE_OTHER): Payer: Self-pay

## 2017-12-21 ENCOUNTER — Ambulatory Visit: Payer: Medicare Other | Admitting: Internal Medicine

## 2017-12-21 ENCOUNTER — Encounter: Payer: Self-pay | Admitting: Internal Medicine

## 2017-12-22 ENCOUNTER — Ambulatory Visit (HOSPITAL_COMMUNITY)
Admission: RE | Admit: 2017-12-22 | Discharge: 2017-12-22 | Disposition: A | Discharge status: Home / Self Care | Payer: Medicare Other | Source: Ambulatory Visit | Attending: *Deleted | Admitting: *Deleted

## 2017-12-22 DIAGNOSIS — E041 Nontoxic single thyroid nodule: Secondary | ICD-10-CM | POA: Diagnosis not present

## 2018-02-14 ENCOUNTER — Ambulatory Visit: Payer: Medicare Other | Admitting: Internal Medicine

## 2018-02-14 ENCOUNTER — Encounter: Payer: Self-pay | Admitting: Internal Medicine

## 2018-02-14 VITALS — BP 116/50 | HR 67 | Ht 69.0 in | Wt 187.0 lb

## 2018-02-14 DIAGNOSIS — R001 Bradycardia, unspecified: Secondary | ICD-10-CM

## 2018-02-14 DIAGNOSIS — I493 Ventricular premature depolarization: Secondary | ICD-10-CM | POA: Diagnosis not present

## 2018-02-14 DIAGNOSIS — I428 Other cardiomyopathies: Secondary | ICD-10-CM | POA: Diagnosis not present

## 2018-02-14 NOTE — Patient Instructions (Addendum)
Medication Instructions:  Your physician recommends that you continue on your current medications as directed. Please refer to the Current Medication list given to you today.  Labwork: You will have a CMET, CBC, and TSH drawn today  Testing/Procedures: None ordered.  Follow-Up: Your physician recommends that you schedule a follow-up appointment in: One Year with Francis Dowse, PA    Any Other Special Instructions Will Be Listed Below (If Applicable).     If you need a refill on your cardiac medications before your next appointment, please call your pharmacy.

## 2018-02-14 NOTE — Progress Notes (Signed)
f      Patient Care Team: Cordial, [name], MD as PCP - General   HPI  Seth Hunt is a 84 y.o. male With frequent PVCs of multiple morphologies we used amiodarone.  Holter monitoring demonstrated significant suppression of ventricular ectopy now down to 0.3% previously. Not recently reassessed  He had developed visual symptoms. Amiodarone was held while he went to see his eye doctor; subsequently resumed.     DATE TEST EF    y3/15 Echo   35 %         Patient denies symptoms of GI intolerance, sun sensitivity, neurological symptoms attributable to amiodarone.  Surveillance laboratories were in normal limits when checked 9 months  ago   Date Cr K TSH LFTs PFTs  6/18  1.39 4.3 1.24 10     He denies chest pain or shortness of breath or peripheral edema.  His complaint is that his ears are getting away from him.  I often scotch tape but he declined.  Past Medical History:  Diagnosis Date  . BPH (benign prostatic hyperplasia)   . CAD (coronary artery disease)    a. 04/2013 - abnl stress echo with possible fall in EF. f/u cath - 50% mid LAD lesion, an anomalous patent circumflex and mild RCA lesions. EF 40%.  . Cardiomyopathy (HCC)    a. 04/2013: EF 40% by cath.  Marland Kitchen COPD (chronic obstructive pulmonary disease) (HCC)   . Diabetes mellitus without complication [facility])     Past Surgical History:  Procedure Laterality Date  . BACK SURGERY    . CHOLECYSTECTOMY    . HERNIA REPAIR     bilateral inguinal hernia    Current Outpatient Medications  Medication Sig Dispense Refill  . amiodarone (PACERONE) 200 MG tablet Take 0.5 tablets (100 mg total) by mouth daily. 45 tablet 0  . aspirin EC 81 MG tablet Take 81 mg by mouth daily.    Marland Kitchen FLUoxetine (PROZAC) 20 MG capsule Take 20 mg by mouth every morning.    . furosemide (LASIX) 40 MG tablet Take 1 tablet (40 mg total) by mouth daily.    Marland Kitchen glimepiride (AMARYL) 4 MG tablet Take 4 mg by mouth daily.    Marland Kitchen losartan (COZAAR) 25 MG  tablet Take 1 tablet (25 mg total) by mouth daily. 30 tablet 11  . simvastatin (ZOCOR) 10 MG tablet Take 1 tablet (10 mg total) by mouth at bedtime. 30 tablet 3  . tamsulosin (FLOMAX) 0.4 MG CAPS capsule Take 0.4 mg by mouth every morning.      No current facility-administered medications for this visit.     No Known Allergies  Review of Systems negative except from HPI and PMH  Physical Exam BP (!) 116/50   Pulse 67   Ht 5\' 9"  (1.753 m)   Wt 187 lb (84.8 kg)   SpO2 94%   BMI 27.62 kg/m  Well developed and nourished in no acute distress HENT normal Neck supple with JVP-flat Clear Regular rate and rhythm, no murmurs or gallops Abd-soft with active BS No Clubbing cyanosis edema Skin-warm and dry A & Oriented  Grossly normal sensory and motor function   ECG demonstrated sinus at 67 Intervals 31/10/39 Low voltage limb leads PVCs on the rhythm strip with a right bundle inferior axis morphology  Assessment and  Plan  PVCs  Cardiomyopathy--nonischemic  Hypertension   High risk medication surveillance   He seems to be doing much better.  We will continue him on his  amiodarone as he still has a relatively high burden of PVCs and appears to be tolerating it well.  Euvolemic continue current meds  Blood pressures well controlled  Check amiodarone surveillance laboratories today.

## 2018-02-15 LAB — COMPREHENSIVE METABOLIC PANEL
A/G RATIO: 1.8 (ref 1.2–2.2)
ALT: 12 IU/L (ref 0–44)
AST: 13 IU/L (ref 0–40)
Albumin: 4.1 g/dL (ref 3.5–4.7)
Alkaline Phosphatase: 74 IU/L (ref 39–117)
BUN/Creatinine Ratio: 21 (ref 10–24)
BUN: 36 mg/dL — ABNORMAL HIGH (ref 8–27)
Bilirubin Total: 0.3 mg/dL (ref 0.0–1.2)
CALCIUM: 9.7 mg/dL (ref 8.6–10.2)
CO2: 24 mmol/L (ref 20–29)
CREATININE: 1.75 mg/dL — AB (ref 0.76–1.27)
Chloride: 103 mmol/L (ref 96–106)
GFR, EST AFRICAN AMERICAN: 41 mL/min/{1.73_m2} — AB (ref >59)
GFR, EST NON AFRICAN AMERICAN: 35 mL/min/{1.73_m2} — AB (ref >59)
GLOBULIN, TOTAL: 2.3 g/dL (ref 1.5–4.5)
Glucose: 193 mg/dL — ABNORMAL HIGH (ref 65–99)
POTASSIUM: 4.4 mmol/L (ref 3.5–5.2)
SODIUM: 147 mmol/L — AB (ref 134–144)
TOTAL PROTEIN: 6.4 g/dL (ref 6.0–8.5)

## 2018-02-15 LAB — CBC WITH DIFFERENTIAL/PLATELET
BASOS: 0 %
Basophils Absolute: 0 10*3/uL (ref 0.0–0.2)
EOS (ABSOLUTE): 0.1 10*3/uL (ref 0.0–0.4)
EOS: 1 %
HEMATOCRIT: 40.3 % (ref 37.5–51.0)
Hemoglobin: 13.5 g/dL (ref 13.0–17.7)
IMMATURE GRANS (ABS): 0 10*3/uL (ref 0.0–0.1)
IMMATURE GRANULOCYTES: 0 %
LYMPHS: 28 %
Lymphocytes Absolute: 2.1 10*3/uL (ref 0.7–3.1)
MCH: 29 pg (ref 26.6–33.0)
MCHC: 33.5 g/dL (ref 31.5–35.7)
MCV: 87 fL (ref 79–97)
MONOS ABS: 0.5 10*3/uL (ref 0.1–0.9)
Monocytes: 6 %
NEUTROS ABS: 4.9 10*3/uL (ref 1.4–7.0)
NEUTROS PCT: 65 %
Platelets: 176 10*3/uL (ref 150–379)
RBC: 4.66 x10E6/uL (ref 4.14–5.80)
RDW: 14.1 % (ref 12.3–15.4)
WBC: 7.6 10*3/uL (ref 3.4–10.8)

## 2018-02-15 LAB — TSH: TSH: 0.957 u[IU]/mL (ref 0.450–4.500)

## 2018-03-01 ENCOUNTER — Telehealth: Payer: Self-pay

## 2018-03-01 DIAGNOSIS — Z79899 Other long term (current) drug therapy: Secondary | ICD-10-CM

## 2018-03-01 NOTE — Telephone Encounter (Signed)
-----   Message from Duke Salvia, MD sent at 02/25/2018  8:05 AM EDT ----- Please Inform Patient that labs are normal x renal functdion is somewhat worse.  plz have his stop losartan, and we can repeat lab work in 2 weeks Thanks

## 2018-03-01 NOTE — Telephone Encounter (Signed)
Spoke with pt's daughter Lorelle Formosa. I gave her the order to stop Mr Giambra Losartan and to have a repeat BMP drawn on April 3. She agreed to the plan and had no additional questions.

## 2018-03-16 ENCOUNTER — Other Ambulatory Visit: Payer: Medicare Other | Admitting: *Deleted

## 2018-03-16 DIAGNOSIS — Z79899 Other long term (current) drug therapy: Secondary | ICD-10-CM

## 2018-03-17 LAB — BASIC METABOLIC PANEL
BUN/Creatinine Ratio: 17 (ref 10–24)
BUN: 28 mg/dL — AB (ref 8–27)
CO2: 25 mmol/L (ref 20–29)
CREATININE: 1.69 mg/dL — AB (ref 0.76–1.27)
Calcium: 9.1 mg/dL (ref 8.6–10.2)
Chloride: 106 mmol/L (ref 96–106)
GFR calc non Af Amer: 37 mL/min/{1.73_m2} — ABNORMAL LOW (ref >59)
GFR, EST AFRICAN AMERICAN: 42 mL/min/{1.73_m2} — AB (ref >59)
Glucose: 156 mg/dL — ABNORMAL HIGH (ref 65–99)
Potassium: 4.5 mmol/L (ref 3.5–5.2)
SODIUM: 146 mmol/L — AB (ref 134–144)

## 2018-03-31 ENCOUNTER — Encounter: Payer: Self-pay | Admitting: *Deleted

## 2018-04-20 IMAGING — DX DG CHEST 2V
3 series · 3 of 3 positions shown · non-contrast
Comparison: December 31, 2013

CLINICAL DATA: Shortness of breath and cough

EXAM:
CHEST  2 VIEW

[chest pa (1 of 2)]
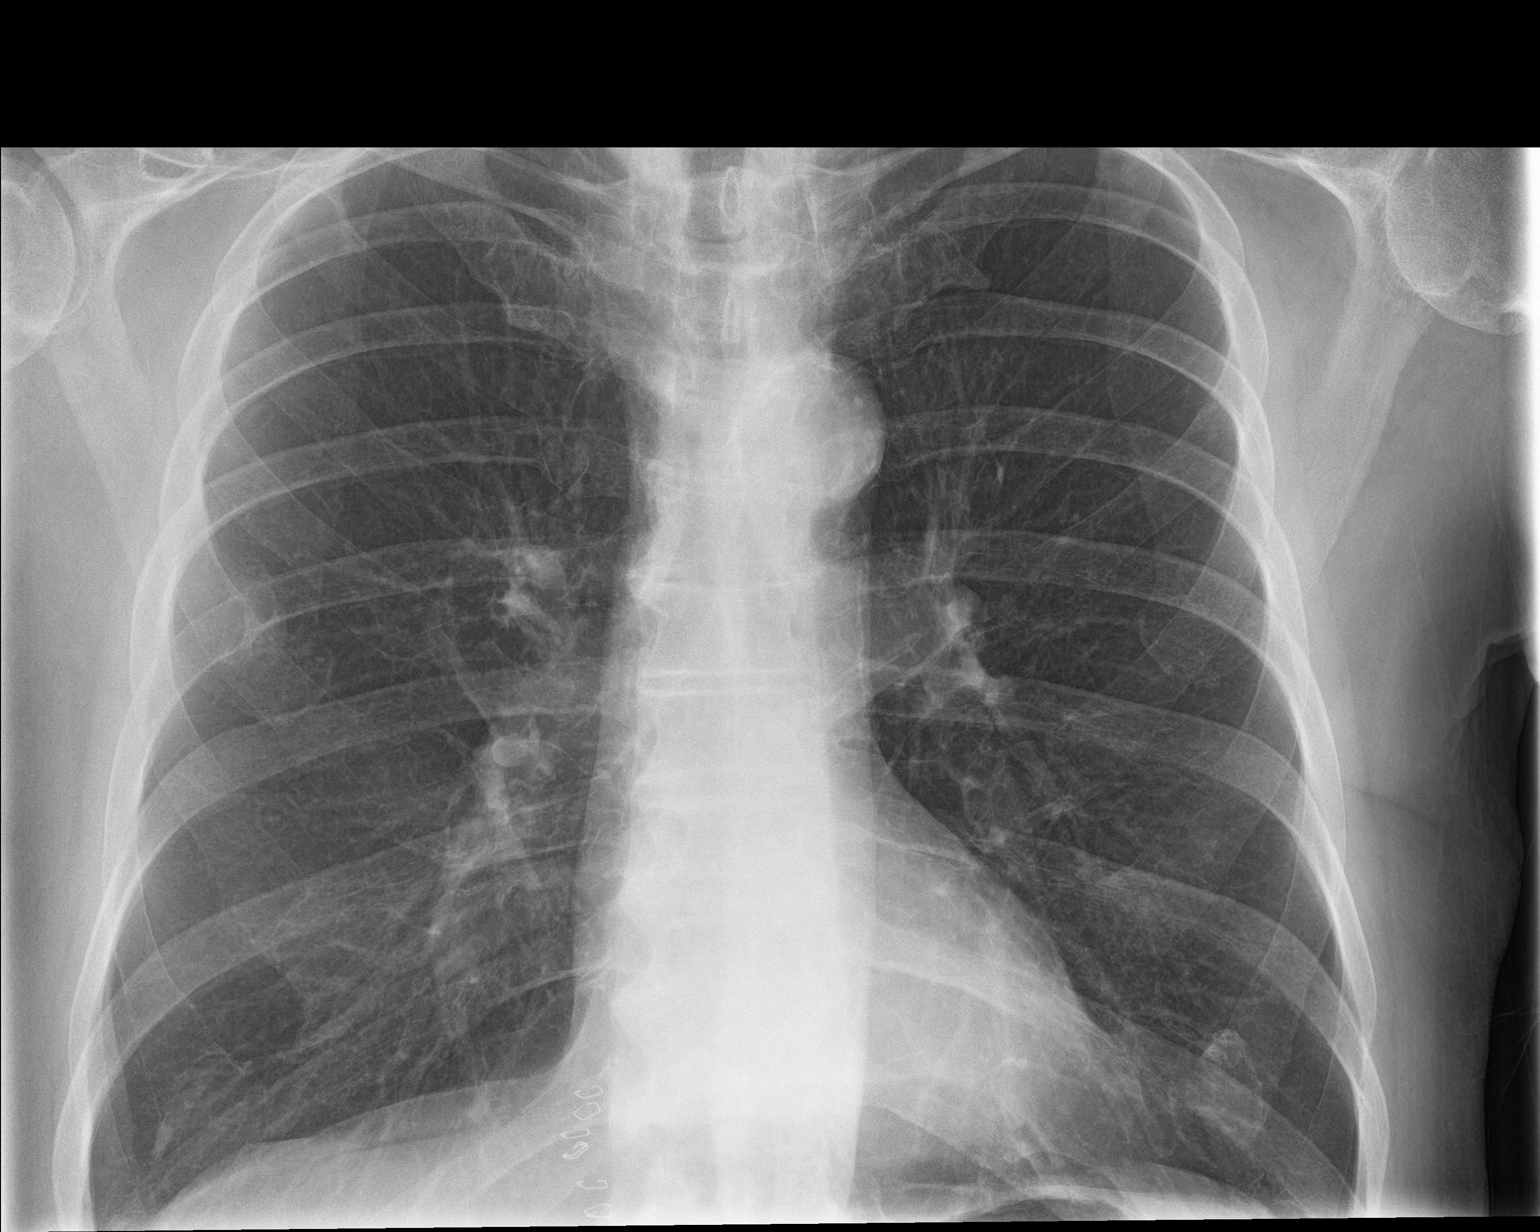

[chest lat]
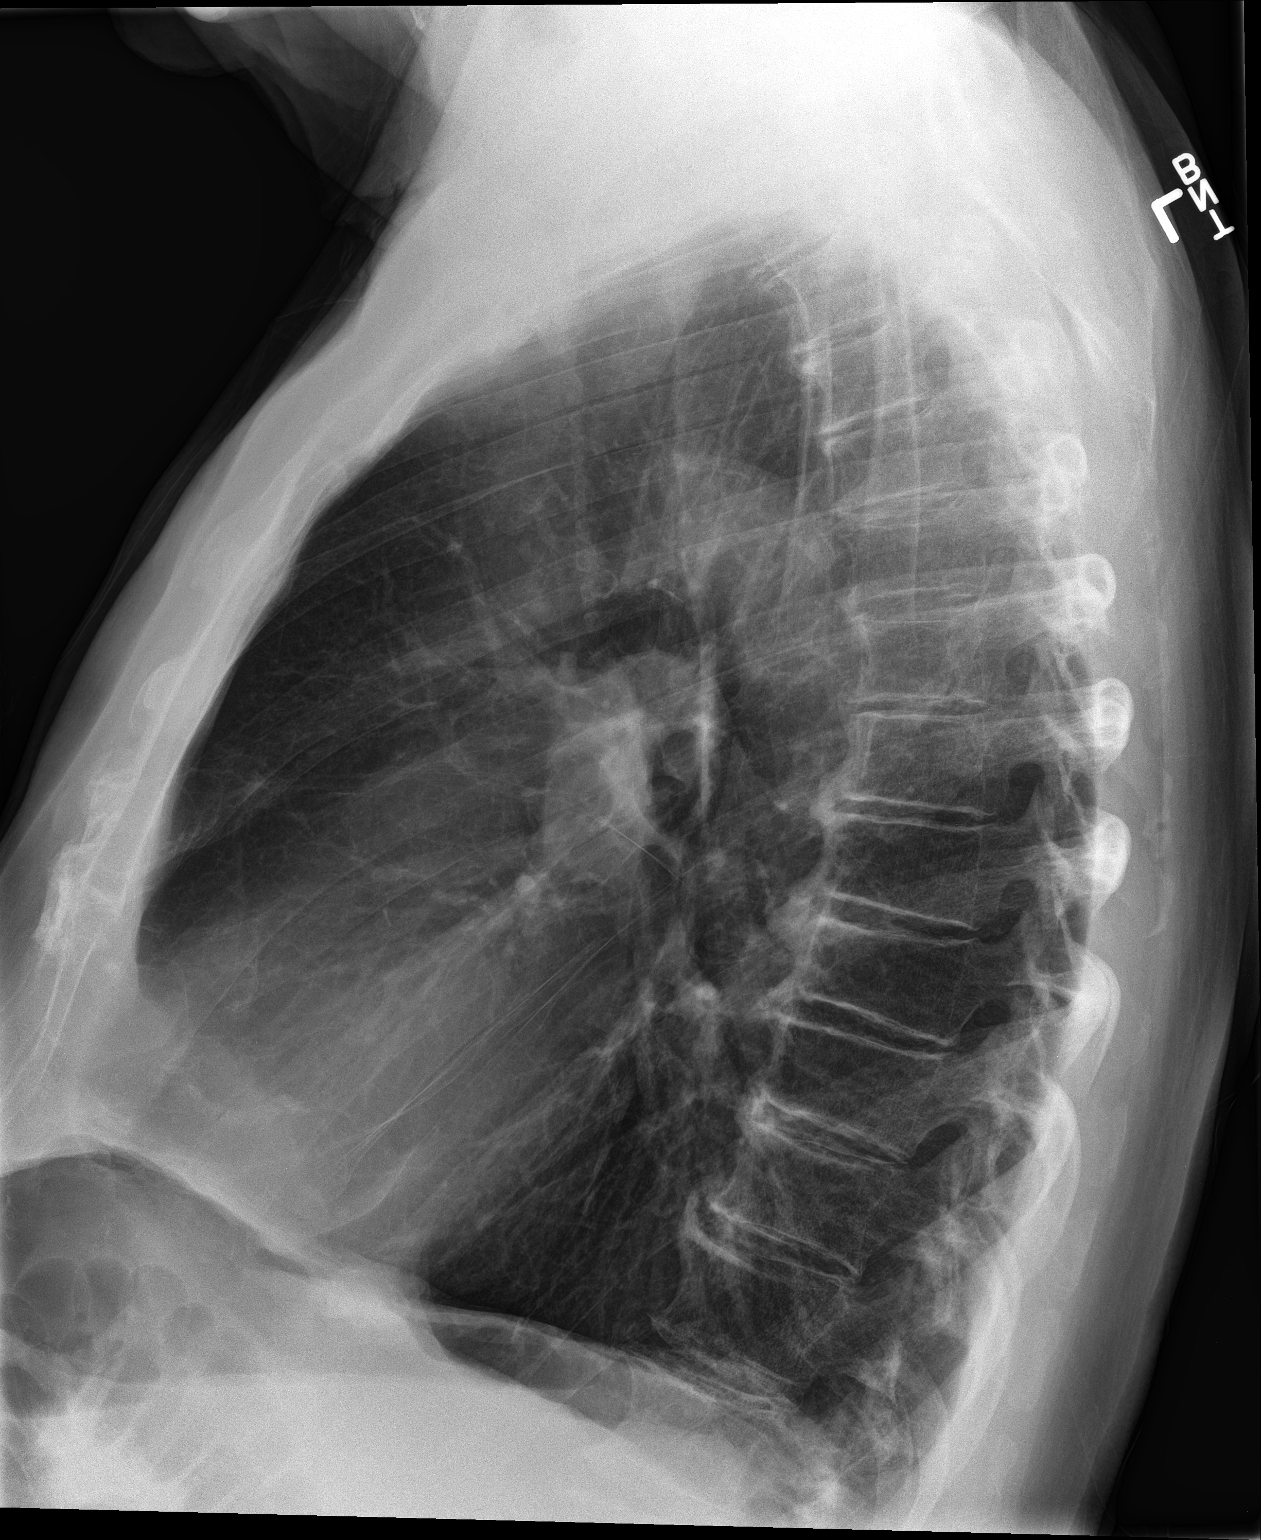

[chest pa (2 of 2)]
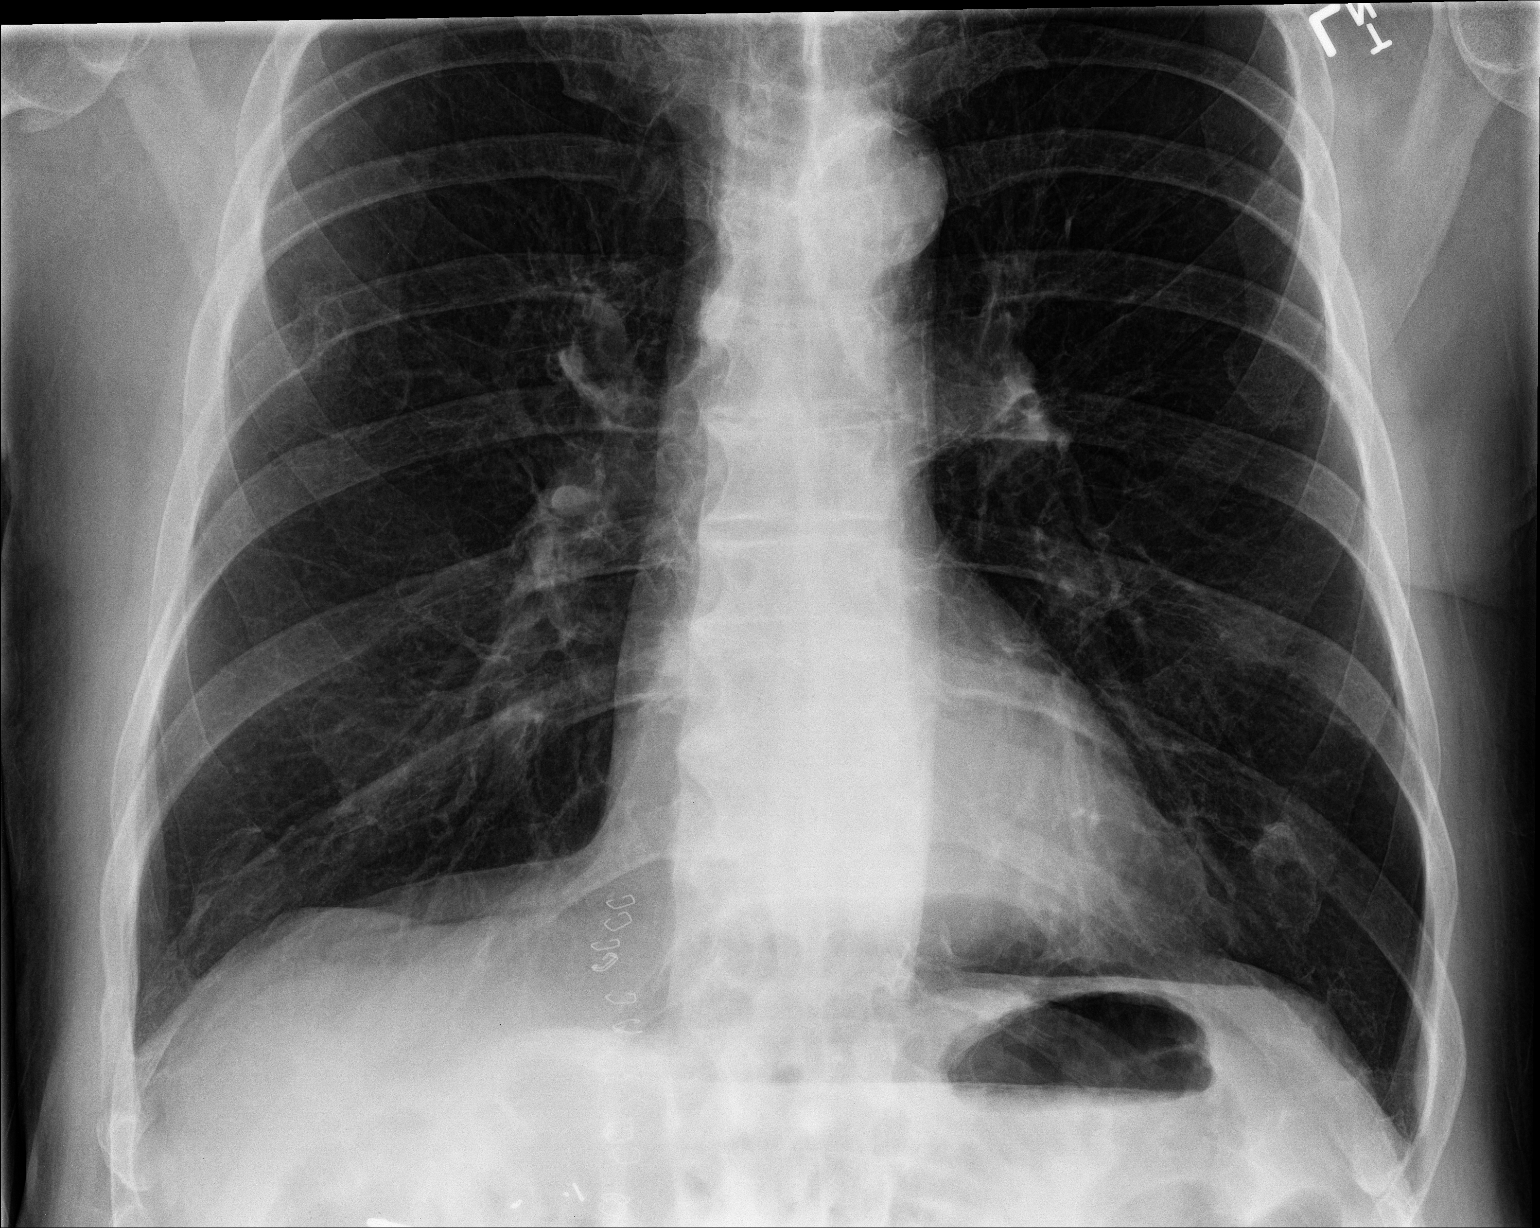

[3 of 3 positions shown; findings below may reference images not displayed]

FINDINGS: There is no edema or consolidation. Heart size and pulmonary
vascularity are within normal limits. No adenopathy. There is
atherosclerotic calcification in the aorta. There is degenerative
change in the thoracic spine. There is evidence of old trauma
involving the right seventh rib posteriorly.
IMPRESSION: No edema or consolidation.  Aortic atherosclerosis.

## 2019-07-26 ENCOUNTER — Telehealth: Payer: Self-pay

## 2019-07-26 NOTE — Telephone Encounter (Signed)
Called pt to schedule f/u off of recall list. No answer.

## 2019-12-19 ENCOUNTER — Other Ambulatory Visit (HOSPITAL_COMMUNITY): Payer: Self-pay | Admitting: *Deleted

## 2019-12-19 DIAGNOSIS — Z87891 Personal history of nicotine dependence: Secondary | ICD-10-CM

## 2020-03-14 ENCOUNTER — Other Ambulatory Visit (HOSPITAL_COMMUNITY): Payer: Self-pay | Admitting: *Deleted

## 2020-03-14 ENCOUNTER — Ambulatory Visit (HOSPITAL_COMMUNITY)
Admission: RE | Admit: 2020-03-14 | Discharge: 2020-03-14 | Disposition: A | Discharge status: Home / Self Care | Payer: Medicare Other | Source: Ambulatory Visit | Attending: *Deleted | Admitting: *Deleted

## 2020-03-14 ENCOUNTER — Other Ambulatory Visit: Payer: Self-pay

## 2020-03-14 DIAGNOSIS — R059 Cough, unspecified: Secondary | ICD-10-CM

## 2020-03-14 DIAGNOSIS — R05 Cough: Secondary | ICD-10-CM

## 2022-01-01 ENCOUNTER — Emergency Department (HOSPITAL_COMMUNITY): Payer: Medicare Other

## 2022-01-01 ENCOUNTER — Other Ambulatory Visit: Payer: Self-pay

## 2022-01-01 ENCOUNTER — Encounter (HOSPITAL_COMMUNITY): Payer: Self-pay

## 2022-01-01 ENCOUNTER — Inpatient Hospital Stay (HOSPITAL_COMMUNITY)
Admission: EM | Admit: 2022-01-01 | Discharge: 2022-01-03 | DRG: 190 | Disposition: A | Discharge status: Home Health | Payer: Medicare Other | Attending: Internal Medicine | Admitting: Internal Medicine

## 2022-01-01 DIAGNOSIS — J440 Chronic obstructive pulmonary disease with acute lower respiratory infection: Secondary | ICD-10-CM | POA: Diagnosis present

## 2022-01-01 DIAGNOSIS — Z7984 Long term (current) use of oral hypoglycemic drugs: Secondary | ICD-10-CM

## 2022-01-01 DIAGNOSIS — Z833 Family history of diabetes mellitus: Secondary | ICD-10-CM

## 2022-01-01 DIAGNOSIS — Z20822 Contact with and (suspected) exposure to covid-19: Secondary | ICD-10-CM | POA: Diagnosis present

## 2022-01-01 DIAGNOSIS — I10 Essential (primary) hypertension: Secondary | ICD-10-CM

## 2022-01-01 DIAGNOSIS — R9431 Abnormal electrocardiogram [ECG] [EKG]: Secondary | ICD-10-CM

## 2022-01-01 DIAGNOSIS — E785 Hyperlipidemia, unspecified: Secondary | ICD-10-CM | POA: Diagnosis present

## 2022-01-01 DIAGNOSIS — Z79899 Other long term (current) drug therapy: Secondary | ICD-10-CM

## 2022-01-01 DIAGNOSIS — N400 Enlarged prostate without lower urinary tract symptoms: Secondary | ICD-10-CM | POA: Diagnosis present

## 2022-01-01 DIAGNOSIS — Z8249 Family history of ischemic heart disease and other diseases of the circulatory system: Secondary | ICD-10-CM | POA: Diagnosis not present

## 2022-01-01 DIAGNOSIS — R55 Syncope and collapse: Secondary | ICD-10-CM | POA: Diagnosis present

## 2022-01-01 DIAGNOSIS — I2510 Atherosclerotic heart disease of native coronary artery without angina pectoris: Secondary | ICD-10-CM | POA: Diagnosis present

## 2022-01-01 DIAGNOSIS — J154 Pneumonia due to other streptococci: Secondary | ICD-10-CM | POA: Diagnosis present

## 2022-01-01 DIAGNOSIS — I428 Other cardiomyopathies: Secondary | ICD-10-CM | POA: Diagnosis present

## 2022-01-01 DIAGNOSIS — J441 Chronic obstructive pulmonary disease with (acute) exacerbation: Principal | ICD-10-CM | POA: Diagnosis present

## 2022-01-01 DIAGNOSIS — J449 Chronic obstructive pulmonary disease, unspecified: Secondary | ICD-10-CM | POA: Diagnosis present

## 2022-01-01 DIAGNOSIS — J9601 Acute respiratory failure with hypoxia: Secondary | ICD-10-CM | POA: Diagnosis present

## 2022-01-01 DIAGNOSIS — K219 Gastro-esophageal reflux disease without esophagitis: Secondary | ICD-10-CM | POA: Diagnosis present

## 2022-01-01 DIAGNOSIS — E119 Type 2 diabetes mellitus without complications: Secondary | ICD-10-CM

## 2022-01-01 DIAGNOSIS — I5022 Chronic systolic (congestive) heart failure: Secondary | ICD-10-CM | POA: Diagnosis not present

## 2022-01-01 DIAGNOSIS — I130 Hypertensive heart and chronic kidney disease with heart failure and stage 1 through stage 4 chronic kidney disease, or unspecified chronic kidney disease: Secondary | ICD-10-CM | POA: Diagnosis present

## 2022-01-01 DIAGNOSIS — Z87891 Personal history of nicotine dependence: Secondary | ICD-10-CM

## 2022-01-01 DIAGNOSIS — J189 Pneumonia, unspecified organism: Secondary | ICD-10-CM

## 2022-01-01 DIAGNOSIS — I169 Hypertensive crisis, unspecified: Secondary | ICD-10-CM | POA: Diagnosis present

## 2022-01-01 DIAGNOSIS — R5381 Other malaise: Secondary | ICD-10-CM

## 2022-01-01 DIAGNOSIS — Z7982 Long term (current) use of aspirin: Secondary | ICD-10-CM | POA: Diagnosis not present

## 2022-01-01 DIAGNOSIS — Z9049 Acquired absence of other specified parts of digestive tract: Secondary | ICD-10-CM

## 2022-01-01 DIAGNOSIS — N1832 Chronic kidney disease, stage 3b: Secondary | ICD-10-CM | POA: Diagnosis present

## 2022-01-01 DIAGNOSIS — I5042 Chronic combined systolic (congestive) and diastolic (congestive) heart failure: Secondary | ICD-10-CM | POA: Diagnosis present

## 2022-01-01 DIAGNOSIS — E1122 Type 2 diabetes mellitus with diabetic chronic kidney disease: Secondary | ICD-10-CM | POA: Diagnosis present

## 2022-01-01 LAB — CBC WITH DIFFERENTIAL/PLATELET
Abs Immature Granulocytes: 0.02 10*3/uL (ref 0.00–0.07)
Basophils Absolute: 0 10*3/uL (ref 0.0–0.1)
Basophils Relative: 0 %
Eosinophils Absolute: 0 10*3/uL (ref 0.0–0.5)
Eosinophils Relative: 0 %
HCT: 32 % — ABNORMAL LOW (ref 39.0–52.0)
Hemoglobin: 10.4 g/dL — ABNORMAL LOW (ref 13.0–17.0)
Immature Granulocytes: 0 %
Lymphocytes Relative: 13 %
Lymphs Abs: 1.1 10*3/uL (ref 0.7–4.0)
MCH: 28.1 pg (ref 26.0–34.0)
MCHC: 32.5 g/dL (ref 30.0–36.0)
MCV: 86.5 fL (ref 80.0–100.0)
Monocytes Absolute: 0.5 10*3/uL (ref 0.1–1.0)
Monocytes Relative: 6 %
Neutro Abs: 6.5 10*3/uL (ref 1.7–7.7)
Neutrophils Relative %: 81 %
Platelets: 168 10*3/uL (ref 150–400)
RBC: 3.7 MIL/uL — ABNORMAL LOW (ref 4.22–5.81)
RDW: 13.2 % (ref 11.5–15.5)
WBC: 8.1 10*3/uL (ref 4.0–10.5)
nRBC: 0 % (ref 0.0–0.2)

## 2022-01-01 LAB — COMPREHENSIVE METABOLIC PANEL
ALT: 12 U/L (ref 0–44)
AST: 17 U/L (ref 15–41)
Albumin: 2.8 g/dL — ABNORMAL LOW (ref 3.5–5.0)
Alkaline Phosphatase: 65 U/L (ref 38–126)
Anion gap: 12 (ref 5–15)
BUN: 35 mg/dL — ABNORMAL HIGH (ref 8–23)
CO2: 24 mmol/L (ref 22–32)
Calcium: 8.6 mg/dL — ABNORMAL LOW (ref 8.9–10.3)
Chloride: 99 mmol/L (ref 98–111)
Creatinine, Ser: 1.69 mg/dL — ABNORMAL HIGH (ref 0.61–1.24)
GFR, Estimated: 39 mL/min — ABNORMAL LOW (ref >60)
Glucose, Bld: 170 mg/dL — ABNORMAL HIGH (ref 70–99)
Potassium: 3.6 mmol/L (ref 3.5–5.1)
Sodium: 135 mmol/L (ref 135–145)
Total Bilirubin: 1.1 mg/dL (ref 0.3–1.2)
Total Protein: 6.9 g/dL (ref 6.5–8.1)

## 2022-01-01 LAB — RESP PANEL BY RT-PCR (FLU A&B, COVID) ARPGX2
Influenza A by PCR: NEGATIVE
Influenza B by PCR: NEGATIVE
SARS Coronavirus 2 by RT PCR: NEGATIVE

## 2022-01-01 LAB — BRAIN NATRIURETIC PEPTIDE: B Natriuretic Peptide: 306 pg/mL — ABNORMAL HIGH (ref 0.0–100.0)

## 2022-01-01 LAB — GLUCOSE, CAPILLARY: Glucose-Capillary: 217 mg/dL — ABNORMAL HIGH (ref 70–99)

## 2022-01-01 MED ORDER — AZITHROMYCIN 250 MG PO TABS: 250.0000 mg | ORAL_TABLET | Freq: Every day | ORAL | 0 refills | Status: DC

## 2022-01-01 MED ORDER — GUAIFENESIN ER 600 MG PO TB12
600.0000 mg | ORAL_TABLET | Freq: Two times a day (BID) | ORAL | Status: DC
  Administered 2022-01-01 – 2022-01-03 (×4): 600 mg via ORAL
  Filled 2022-01-01 (×4): qty 1

## 2022-01-01 MED ORDER — INSULIN ASPART 100 UNIT/ML IJ SOLN
0.0000 [IU] | Freq: Three times a day (TID) | INTRAMUSCULAR | Status: DC
  Administered 2022-01-02: 7 [IU] via SUBCUTANEOUS
  Administered 2022-01-02 (×2): 9 [IU] via SUBCUTANEOUS

## 2022-01-01 MED ORDER — AZITHROMYCIN 250 MG PO TABS
500.0000 mg | ORAL_TABLET | Freq: Once | ORAL | Status: AC
  Administered 2022-01-01: 500 mg via ORAL
  Filled 2022-01-01: qty 2

## 2022-01-01 MED ORDER — ALBUTEROL SULFATE (2.5 MG/3ML) 0.083% IN NEBU: 2.5000 mg | INHALATION_SOLUTION | RESPIRATORY_TRACT | Status: DC | PRN

## 2022-01-01 MED ORDER — METHYLPREDNISOLONE SODIUM SUCC 125 MG IJ SOLR
125.0000 mg | Freq: Once | INTRAMUSCULAR | Status: AC
  Administered 2022-01-01: 125 mg via INTRAVENOUS
  Filled 2022-01-01: qty 2

## 2022-01-01 MED ORDER — AMOXICILLIN-POT CLAVULANATE 875-125 MG PO TABS
1.0000 | ORAL_TABLET | Freq: Once | ORAL | Status: AC
  Administered 2022-01-01: 1 via ORAL
  Filled 2022-01-01: qty 1

## 2022-01-01 MED ORDER — IPRATROPIUM-ALBUTEROL 0.5-2.5 (3) MG/3ML IN SOLN
3.0000 mL | Freq: Four times a day (QID) | RESPIRATORY_TRACT | Status: DC
  Administered 2022-01-01 – 2022-01-02 (×2): 3 mL via RESPIRATORY_TRACT
  Filled 2022-01-01 (×2): qty 3

## 2022-01-01 MED ORDER — ALBUTEROL SULFATE HFA 108 (90 BASE) MCG/ACT IN AERS
2.0000 | INHALATION_SPRAY | Freq: Once | RESPIRATORY_TRACT | Status: AC
  Administered 2022-01-01: 2 via RESPIRATORY_TRACT
  Filled 2022-01-01: qty 6.7

## 2022-01-01 MED ORDER — AMOXICILLIN-POT CLAVULANATE 875-125 MG PO TABS: 1.0000 | ORAL_TABLET | Freq: Two times a day (BID) | ORAL | 0 refills | Status: DC

## 2022-01-01 MED ORDER — HEPARIN SODIUM (PORCINE) 5000 UNIT/ML IJ SOLN
5000.0000 [IU] | Freq: Three times a day (TID) | INTRAMUSCULAR | Status: DC
  Administered 2022-01-01 – 2022-01-03 (×5): 5000 [IU] via SUBCUTANEOUS
  Filled 2022-01-01 (×5): qty 1

## 2022-01-01 MED ORDER — INSULIN ASPART 100 UNIT/ML IJ SOLN
0.0000 [IU] | Freq: Every day | INTRAMUSCULAR | Status: DC
  Administered 2022-01-01: 2 [IU] via SUBCUTANEOUS
  Administered 2022-01-02: 3 [IU] via SUBCUTANEOUS

## 2022-01-01 MED ORDER — ALBUTEROL SULFATE (2.5 MG/3ML) 0.083% IN NEBU
2.5000 mg | INHALATION_SOLUTION | Freq: Once | RESPIRATORY_TRACT | Status: AC
  Administered 2022-01-01: 2.5 mg via RESPIRATORY_TRACT
  Filled 2022-01-01: qty 3

## 2022-01-01 NOTE — H&P (Signed)
TRH H&P   Patient Demographics:    Seth Hunt, is a 88 y.o. male  MRN: [id number]   DOB - 11/02/1933  Admit Date - 01/01/2022  Outpatient Primary MD for the patient is Cordial, Launa Grill, MD  Referring MD/NP/PA: Dr Sabra Heck  Outpatient Specialists: EP cardiology Dr Caryl Comes    Patient coming from: home  Chief Complaint  Patient presents with   Shortness of Breath   Nasal Congestion      HPI:    Seth Hunt  is a 88 y.o. male, with past medical history of hypertension, diabetes mellitus, hyperlipidemia, nonischemic cardiomyopathy, PVCs followed by Dr. Caryl Comes on amiodarone, COPD, patient presents to ED secondary to worsening dyspnea, cough, with a productive phlegm, reports dyspnea with minimal activity, not at rest, it takes him around 30 minutes to recover, he does report progressive phlegm as well, he is with known history of CHF, but denies any orthopnea or lower extremity edema, denies any fever, chills, no recent sick contacts, no chest pain, no nausea, no vomiting, no abdominal pain, he did report episode of syncope earlier in the week where he was not feeling well. -in ED chest x-ray significant for right lower lobe pneumonia, he was hypoxic with activity, and at 1.69 which is his baseline, glucose at 170, albumin at 2.8, hemoglobin at 10.4, BNP elevated at 306, but he has no edema, no JVD and no vascular congestion on imaging, significantly improved after receiving nebs and Solu-Medrol, [facility] hospitalist consulted to admit.   Review of systems:    In addition to the HPI above,  No Fever-chills, syncope No Headache, No changes with Vision or hearing, No problems swallowing food or Liquids, No Chest pain, ports dyspnea and productive cough No Abdominal pain, No Nausea or Vommitting, Bowel movements are regular, No Blood in stool or Urine, No dysuria, No new skin rashes or  bruises, No new joints pains-aches,  No new weakness, tingling, numbness in any extremity, No recent weight gain or loss, No polyuria, polydypsia or polyphagia, No significant Mental Stressors.  A full 10 point Review of Systems was done, except as stated above, all other Review of Systems were negative.   With Past History of the following :    Past Medical History:  Diagnosis Date   BPH (benign prostatic hyperplasia)    CAD (coronary artery disease)    a. 04/2013 - abnl stress echo with possible fall in EF. f/u cath - 50% mid LAD lesion, an anomalous patent circumflex and mild RCA lesions. EF 40%.   Cardiomyopathy ([place])    a. 04/2013: EF 40% by cath.   COPD (chronic obstructive pulmonary disease) (HCC)    Diabetes mellitus without complication ([place])       Past Surgical History:  Procedure Laterality Date   BACK SURGERY     CHOLECYSTECTOMY     HERNIA REPAIR     bilateral  inguinal hernia      Social History:     Social History   Tobacco Use   Smoking status: Former    Packs/day: 1.00    Years: 20.00    Pack years: 20.00    Types: Cigarettes   Smokeless tobacco: Never   Tobacco comments:    Quit 20 years ago  Substance Use Topics   Alcohol use: No     Family History :     Family History  Problem Relation Age of Onset   CAD Father 35   CAD Mother 29       Lived until age 74   Cancer Sister    Heart attack Brother    Stroke Brother    Diabetes Brother      Home Medications:   Prior to Admission medications   Medication Sig Start Date End Date Taking? Authorizing Provider  amiodarone (PACERONE) 200 MG tablet Take 0.5 tablets (100 mg total) by mouth daily. 06/29/16  Yes Chanetta Marshall K, NP  amoxicillin-clavulanate (AUGMENTIN) 875-125 MG tablet Take 1 tablet by mouth every 12 (twelve) hours. 01/01/22  Yes Caccavale, Sophia, PA-C  aspirin EC 81 MG tablet Take 81 mg by mouth daily.   Yes [provider]  azithromycin (ZITHROMAX) 250 MG tablet Take  1 tablet (250 mg total) by mouth daily. Take first 2 tablets together, then 1 every day until finished. 01/01/22  Yes Caccavale, Sophia, PA-C  FLUoxetine (PROZAC) 20 MG capsule Take 20 mg by mouth every morning.   Yes [provider]  furosemide (LASIX) 40 MG tablet Take 1 tablet (40 mg total) by mouth daily. 05/04/17  Yes Deboraha Sprang, MD  glimepiride (AMARYL) 4 MG tablet Take 4 mg by mouth daily.   Yes [provider]  losartan (COZAAR) 25 MG tablet Take 1 tablet (25 mg total) by mouth daily. 09/02/15  Yes Deboraha Sprang, MD  montelukast (SINGULAIR) 10 MG tablet Take 10 mg by mouth daily. 10/28/21  Yes [provider]  ramipril (ALTACE) 10 MG capsule Take 10 mg by mouth daily. 11/24/21  Yes [provider]  simvastatin (ZOCOR) 10 MG tablet Take 1 tablet (10 mg total) by mouth at bedtime. 01/02/14  Yes Rai, Ripudeep K, MD  tamsulosin (FLOMAX) 0.4 MG CAPS capsule Take 0.4 mg by mouth every morning.    Yes [provider]     Allergies:    No Known Allergies   Physical Exam:   Vitals  Blood pressure 100/66, pulse 95, temperature (!) 97.5 F (36.4 C), temperature source Oral, resp. rate (!) 27, height 5\' 9"  (1.753 m), weight 72.6 kg, SpO2 93 %.   1. General frail elderly male, laying in bed in mild respiratory discomfort  2. Normal affect and insight, Not Suicidal or Homicidal, Awake Alert, Oriented X 3.  3. No F.N deficits, ALL C.Nerves Intact, Strength 5/5 all 4 extremities, Sensation intact all 4 extremities, Plantars down going.  4. Ears and Eyes appear Normal, Conjunctivae clear, PERRLA. Moist Oral Mucosa.  5. Supple Neck, No JVD, No cervical lymphadenopathy appriciated, No Carotid Bruits.  6. Symmetrical Chest wall movement, mildly tachypneic, but no use of accessory muscles, mildly diminished air entry bilaterally with wheezing  7. RRR, No Gallops, Rubs or Murmurs, No Parasternal Heave.  8. Positive Bowel Sounds, Abdomen Soft, No  tenderness, No organomegaly appriciated,No rebound -guarding or rigidity.  9.  No Cyanosis, Normal Skin Turgor, No Skin Rash or Bruise.  10. Good muscle tone,  joints appear normal , no effusions, Normal ROM.  11. No Palpable Lymph Nodes in Neck or Axillae     Data Review:    CBC Recent Labs  Lab 01/01/22 1715  WBC 8.1  HGB 10.4*  HCT 32.0*  PLT 168  MCV 86.5  MCH 28.1  MCHC 32.5  RDW 13.2  LYMPHSABS 1.1  MONOABS 0.5  EOSABS 0.0  BASOSABS 0.0   ------------------------------------------------------------------------------------------------------------------  Chemistries  Recent Labs  Lab 01/01/22 1715  NA 135  K 3.6  CL 99  CO2 24  GLUCOSE 170*  BUN 35*  CREATININE 1.69*  CALCIUM 8.6*  AST 17  ALT 12  ALKPHOS 65  BILITOT 1.1   ------------------------------------------------------------------------------------------------------------------ estimated creatinine clearance is 31.4 mL/min (A) (by C-G formula based on SCr of 1.69 mg/dL (H)). ------------------------------------------------------------------------------------------------------------------ No results for input(s): TSH, T4TOTAL, T3FREE, THYROIDAB in the last 72 hours.  Invalid input(s): FREET3  Coagulation profile No results for input(s): INR, PROTIME in the last 168 hours. ------------------------------------------------------------------------------------------------------------------- No results for input(s): DDIMER in the last 72 hours. -------------------------------------------------------------------------------------------------------------------  Cardiac Enzymes No results for input(s): CKMB, TROPONINI, MYOGLOBIN in the last 168 hours.  Invalid input(s): CK ------------------------------------------------------------------------------------------------------------------    Component Value Date/Time   BNP 306.0 (H) 01/01/2022 1715      ---------------------------------------------------------------------------------------------------------------  Urinalysis No results found for: COLORURINE, APPEARANCEUR, LABSPEC, PHURINE, GLUCOSEU, HGBUR, BILIRUBINUR, KETONESUR, PROTEINUR, UROBILINOGEN, NITRITE, LEUKOCYTESUR  ----------------------------------------------------------------------------------------------------------------   Imaging Results:    DG Chest 2 View  Result Date: 01/01/2022 CLINICAL DATA:  Short of breath EXAM: CHEST - 2 VIEW COMPARISON:  03/14/2020 FINDINGS: Normal cardiac silhouette. Fine airspace disease in the RIGHT lower lobe. Hyperinflated lungs. Degenerative osteophytosis of the spine. IMPRESSION: RIGHT lower lobe pneumonia. Followup PA and lateral chest X-ray is recommended in 3-4 weeks following trial of antibiotic therapy to ensure resolution and exclude underlying malignancy. Electronically Signed   By: Suzy Bouchard M.D.   On: 01/01/2022 17:40    My personal review of EKG: PENDING   Assessment & Plan:    Principal Problem:   COPD exacerbation ([place]) Active Problems:   COPD (chronic obstructive pulmonary disease) (HCC)   DM (diabetes mellitus) ([place])   HTN (hypertension)   Acute respiratory failure with hypoxia (HCC)   COPD exacerbation -Present with significant wheezing bilaterally, cough, productive phlegm, he will be started on IV Solu-Medrol, scheduled duo nebs and as needed albuterol -He was encouraged use incentive spirometry and flutter valve -Continue with azithromycin and Rocephin -Patient desaturates when he ambulates, will keep on oxygen as needed.  Lobar pneumonia -Right lower lobe pneumonia, will repeat repeat two-view chest x-ray after treatment to ensure resolution -Sputum cultures, Legionella and strep pneumonia -Continue with antibiotics  Chronic systolic/diastolic CHF  -Euvolemic, no evidence of volume overload, continue to monitor closely as on  hydration  CAD -Denies any chest pain, will obtain , continue with aspirin, losartan and statin  Diabetes mellitus -Check A1c, hold glimepiride, keep on insulin sliding scale as expected to have uncontrolled CBGs in the setting of steroid use  Hypertension -Home medications  CKd stage IIIb -At baseline, avoid nephrotoxic medications  History of multiple PVCs, nonischemic cardiomyopathy -Managed by EP cardiology Dr. Caryl Comes, continue with amiodarone   Syncope - patient reports syncope last week, will monitor on telemetry, and check 2D echo. - will obtain  EKG  DVT Prophylaxis Heparin   AM Labs Ordered, also please review Full Orders  Family Communication: Admission, patients condition and plan of care including tests being ordered have been  discussed with the patient who indicate understanding and agree with the plan and Code Status.  Code Status Full  Likely DC to  Home  Condition GUARDED    Consults called: none    Admission status: inpatient    Time spent in minutes : 65 minutes   Phillips Climes M.D on 01/01/2022 at 8:18 PM   [facility] - Office  [phone]

## 2022-01-01 NOTE — ED Triage Notes (Signed)
Patient states increased SHOB for a couple of weeks with chest congestion and cough. Patient states increased SHOB with exertion. Hx of CHF and COPD. Smoker 30 years ago.

## 2022-01-01 NOTE — ED Provider Notes (Signed)
Medical screening examination/treatment/procedure(s) were conducted as a shared visit with non-physician practitioner(s) and myself.  I personally evaluated the patient during the encounter.  Clinical Impression:   Final diagnoses:  Community acquired pneumonia of right lower lobe of lung  COPD exacerbation (HCC)  Abnormal EKG      Patient presenting with increasing shortness of breath with some wheezing and rales.  Oxygen 94 to 95% on room air, minimal tachypnea, minimal tachycardia, no fever.  EKG performed on January 01, 2022 at 4:56 PM shows sinus tachycardia, rate of 105 bpm, right bundle branch block, no ST elevation or depression  I discussed the care with the hospitalist at 7:45 PM, they will admit to the hospital   Eber Hong, MD 01/01/22 1949

## 2022-01-01 NOTE — ED Notes (Signed)
Patient increased SHOB with exertion and desat 88% on room air after walking approximately 6 feet. Patient encouraged to stay in bed and use call bed for assistance due to low oxygen saturation. Sophia, PA made aware and is at bedside speaking with patient.

## 2022-01-01 NOTE — ED Provider Notes (Signed)
[facility] EMERGENCY DEPARTMENT Provider Note   CSN: [id number] Arrival date & time: 01/01/22  1641     History  Chief Complaint  Patient presents with   Shortness of Breath   Nasal Congestion    Seth Hunt is a 88 y.o. male presenting for evaluation of shortness of breath and cough.  Patient states that the past 2 to 3 weeks he has had gradually worsening shortness of breath.  He states he feels fine at rest, but with minimal exertion such as going to and from the bathroom he becomes very short winded and takes 30 minutes to recover.  He reports a history of COPD, does not have any inhalers at home.  Additional history of CHF, has been taking Lasix as prescribed.  Reports normal urination.  No recent fevers.  He has a cough, which is nonproductive.  No recent sick contacts.  No chest pain, nausea, vomiting, abdominal pain, change in urination or bowel movements.  No leg pain or swelling.  HPI     Home Medications Prior to Admission medications   Medication Sig Start Date End Date Taking? Authorizing Provider  amiodarone (PACERONE) 200 MG tablet Take 0.5 tablets (100 mg total) by mouth daily. 06/29/16   Marily Lente, NP  aspirin EC 81 MG tablet Take 81 mg by mouth daily.    [provider]  FLUoxetine (PROZAC) 20 MG capsule Take 20 mg by mouth every morning.    [provider]  furosemide (LASIX) 40 MG tablet Take 1 tablet (40 mg total) by mouth daily. 05/04/17   Duke Salvia, MD  glimepiride (AMARYL) 4 MG tablet Take 4 mg by mouth daily.    [provider]  losartan (COZAAR) 25 MG tablet Take 1 tablet (25 mg total) by mouth daily. 09/02/15   Duke Salvia, MD  simvastatin (ZOCOR) 10 MG tablet Take 1 tablet (10 mg total) by mouth at bedtime. 01/02/14   Rai, Delene Ruffini, MD  tamsulosin (FLOMAX) 0.4 MG CAPS capsule Take 0.4 mg by mouth every morning.     [provider]      Allergies    Patient has no known allergies.    Review of  Systems   Review of Systems  Respiratory:  Positive for cough and shortness of breath.   All other systems reviewed and are negative.  Physical Exam Updated Vital Signs BP 125/77    Pulse (!) 103    Temp (!) 97.5 F (36.4 C) (Oral)    Resp (!) 23    Ht 5\' 9"  (1.753 m)    Wt 72.6 kg    SpO2 95%    BMI 23.63 kg/m  Physical Exam Vitals and nursing note reviewed.  Constitutional:      General: He is not in acute distress.    Appearance: Normal appearance.     Comments: nontoxic  HENT:     Head: Normocephalic and atraumatic.  Eyes:     Extraocular Movements: Extraocular movements intact.     Conjunctiva/sclera: Conjunctivae normal.     Pupils: Pupils are equal, round, and reactive to light.  Cardiovascular:     Rate and Rhythm: Regular rhythm. Tachycardia present.     Pulses: Normal pulses.     Comments: Heart rate in the low 100 Pulmonary:     Effort: Pulmonary effort is normal.     Breath sounds: Examination of the right-upper field reveals decreased breath sounds. Examination of the right-middle field reveals decreased breath sounds.  Examination of the right-lower field reveals decreased breath sounds. Examination of the left-lower field reveals rales. Decreased breath sounds and rales present.     Comments: Speaking in shorter sentences.  Diminished lung sounds on the right.  Rales in the left lower field. SpO2 in the mid 90's on RA Abdominal:     General: There is no distension.     Palpations: Abdomen is soft. There is no mass.     Tenderness: There is no abdominal tenderness. There is no guarding or rebound.  Musculoskeletal:        General: Normal range of motion.     Cervical back: Normal range of motion and neck supple.  Skin:    General: Skin is warm and dry.     Capillary Refill: Capillary refill takes less than 2 seconds.  Neurological:     Mental Status: He is alert and oriented to person, place, and time.  Psychiatric:        Mood and Affect: Mood and affect  normal.        Speech: Speech normal.        Behavior: Behavior normal.    ED Results / Procedures / Treatments   Labs (all labs ordered are listed, but only abnormal results are displayed) Labs Reviewed  RESP PANEL BY RT-PCR (FLU A&B, COVID) ARPGX2  CBC WITH DIFFERENTIAL/PLATELET  COMPREHENSIVE METABOLIC PANEL  BRAIN NATRIURETIC PEPTIDE    EKG None  Radiology No results found.  Procedures Procedures    Medications Ordered in ED Medications  albuterol (PROVENTIL) (2.5 MG/3ML) 0.083% nebulizer solution 2.5 mg (has no administration in time range)    ED Course/ Medical Decision Making/ A&P                           Medical Decision Making Amount and/or Complexity of Data Reviewed Labs: ordered. Radiology: ordered.  Risk Prescription drug management.    This patient presents to the ED for concern of shortness of breath and cough.  This involves an extensive number of treatment options, and is a complaint that carries with it a high risk of complications and morbidity.  The differential diagnosis includes COPD exacerbation, pneumonia, viral illness such as COVID, CHF, ACS, arrhythmia   Co morbidities:  Copd, chf   Lab Tests:  I ordered, and personally interpreted labs.  The pertinent results include:  Minimally elevated BNP at 300, however clinically patient without heart failure exacerbation including edema or fluid on the x-ray.  Anemia noted at 10, no recent labs to compare.  We will have patient follow-up with PCP.  Creatinine minimally elevated, but at baseline. Covid and flu negative.    Imaging Studies:  I ordered imaging studies including chest x-ray I independently visualized and interpreted imaging which showed right lower lobe pneumonia I agree with the radiologist interpretation   Cardiac Monitoring:  The patient was maintained on a cardiac monitor.  I personally viewed and interpreted the cardiac monitored which showed an underlying rhythm of:  NSR   Medicines ordered:  I ordered medication including antibiotics and breathing treatment for shortness of breath Reevaluation of the patient after these medicines showed that the patient improved.  Wheezing improved, there is still some coarse lung sounds throughout but air movement has improved   Attempted ambulation with patient.  Unfortunately, he became very short of breath with minimal exertion and oxygen desatted to 88% on room air.  Breathing improved at rest, however considering patient's  hypoxia with ambulation, he will need to be admitted for further management of his COPD and pneumonia.  Pt signed out to Fleet Contras, MD for admission call.   Final Clinical Impression(s) / ED Diagnoses Final diagnoses:  None    Rx / DC Orders ED Discharge Orders     None         Alveria Apley, PA-C 01/01/22 Bradd Burner, MD 01/01/22 1949

## 2022-01-01 NOTE — ED Notes (Signed)
Patient transported to X-ray 

## 2022-01-01 NOTE — Discharge Instructions (Signed)
Take antibiotics as prescribed. Take the entire course, even if symptoms improve.  Use the inhaler, 2 puffs every 4 hours while awake, for the next 2 days. After this, use as needed for shortness of breath, wheezing, or coughing fits.  Your EKG was changed from the last time. Follow up with your cardiologist for further evaluation.  Call your primary care doctor to set up a follow up visit for recheck of your symptoms.  Return to the ER if you develop increased difficulty breathing, chest pain, confusion, or any new, worsening, or concerning symptoms.

## 2022-01-01 NOTE — ED Notes (Signed)
Patient's o2 sat decreased dramatically and pulse rate increased to 125.

## 2022-01-02 ENCOUNTER — Other Ambulatory Visit (HOSPITAL_COMMUNITY): Payer: Medicare Other

## 2022-01-02 DIAGNOSIS — E1122 Type 2 diabetes mellitus with diabetic chronic kidney disease: Secondary | ICD-10-CM

## 2022-01-02 DIAGNOSIS — N1832 Chronic kidney disease, stage 3b: Secondary | ICD-10-CM

## 2022-01-02 DIAGNOSIS — J449 Chronic obstructive pulmonary disease, unspecified: Secondary | ICD-10-CM

## 2022-01-02 LAB — CBC
HCT: 30.4 % — ABNORMAL LOW (ref 39.0–52.0)
Hemoglobin: 9.9 g/dL — ABNORMAL LOW (ref 13.0–17.0)
MCH: 28.2 pg (ref 26.0–34.0)
MCHC: 32.6 g/dL (ref 30.0–36.0)
MCV: 86.6 fL (ref 80.0–100.0)
Platelets: 161 10*3/uL (ref 150–400)
RBC: 3.51 MIL/uL — ABNORMAL LOW (ref 4.22–5.81)
RDW: 13.2 % (ref 11.5–15.5)
WBC: 6.7 10*3/uL (ref 4.0–10.5)
nRBC: 0 % (ref 0.0–0.2)

## 2022-01-02 LAB — BASIC METABOLIC PANEL
Anion gap: 13 (ref 5–15)
BUN: 37 mg/dL — ABNORMAL HIGH (ref 8–23)
CO2: 25 mmol/L (ref 22–32)
Calcium: 8.8 mg/dL — ABNORMAL LOW (ref 8.9–10.3)
Chloride: 97 mmol/L — ABNORMAL LOW (ref 98–111)
Creatinine, Ser: 1.78 mg/dL — ABNORMAL HIGH (ref 0.61–1.24)
GFR, Estimated: 37 mL/min — ABNORMAL LOW (ref >60)
Glucose, Bld: 296 mg/dL — ABNORMAL HIGH (ref 70–99)
Potassium: 3.5 mmol/L (ref 3.5–5.1)
Sodium: 135 mmol/L (ref 135–145)

## 2022-01-02 LAB — GLUCOSE, CAPILLARY
Glucose-Capillary: 328 mg/dL — ABNORMAL HIGH (ref 70–99)
Glucose-Capillary: 379 mg/dL — ABNORMAL HIGH (ref 70–99)
Glucose-Capillary: 352 mg/dL — ABNORMAL HIGH (ref 70–99)
Glucose-Capillary: 296 mg/dL — ABNORMAL HIGH (ref 70–99)

## 2022-01-02 MED ORDER — IPRATROPIUM-ALBUTEROL 0.5-2.5 (3) MG/3ML IN SOLN
3.0000 mL | Freq: Four times a day (QID) | RESPIRATORY_TRACT | Status: DC
  Administered 2022-01-02 – 2022-01-03 (×4): 3 mL via RESPIRATORY_TRACT
  Filled 2022-01-02 (×3): qty 3

## 2022-01-02 MED ORDER — METHYLPREDNISOLONE SODIUM SUCC 40 MG IJ SOLR
40.0000 mg | Freq: Two times a day (BID) | INTRAMUSCULAR | Status: DC
  Administered 2022-01-02 – 2022-01-03 (×3): 40 mg via INTRAVENOUS
  Filled 2022-01-02 (×3): qty 1

## 2022-01-02 MED ORDER — INSULIN ASPART 100 UNIT/ML IJ SOLN
0.0000 [IU] | Freq: Three times a day (TID) | INTRAMUSCULAR | Status: DC
  Administered 2022-01-03: 3 [IU] via SUBCUTANEOUS
  Administered 2022-01-03: 15 [IU] via SUBCUTANEOUS
  Administered 2022-01-03: 8 [IU] via SUBCUTANEOUS

## 2022-01-02 MED ORDER — CEFDINIR 300 MG PO CAPS
300.0000 mg | ORAL_CAPSULE | Freq: Every day | ORAL | Status: DC
  Administered 2022-01-02 – 2022-01-03 (×2): 300 mg via ORAL
  Filled 2022-01-02 (×2): qty 1

## 2022-01-02 MED ORDER — BUDESONIDE 0.5 MG/2ML IN SUSP
0.5000 mg | Freq: Two times a day (BID) | RESPIRATORY_TRACT | Status: DC
  Administered 2022-01-02 – 2022-01-03 (×3): 0.5 mg via RESPIRATORY_TRACT
  Filled 2022-01-02 (×3): qty 2

## 2022-01-02 MED ORDER — ARFORMOTEROL TARTRATE 15 MCG/2ML IN NEBU
15.0000 ug | INHALATION_SOLUTION | Freq: Two times a day (BID) | RESPIRATORY_TRACT | Status: DC
  Administered 2022-01-02 – 2022-01-03 (×3): 15 ug via RESPIRATORY_TRACT
  Filled 2022-01-02 (×3): qty 2

## 2022-01-02 MED ORDER — IPRATROPIUM-ALBUTEROL 0.5-2.5 (3) MG/3ML IN SOLN: 3.0000 mL | Freq: Three times a day (TID) | RESPIRATORY_TRACT | Status: DC

## 2022-01-02 MED ORDER — INSULIN DETEMIR 100 UNIT/ML ~~LOC~~ SOLN
12.0000 [IU] | Freq: Every day | SUBCUTANEOUS | Status: DC
  Administered 2022-01-03: 12 [IU] via SUBCUTANEOUS
  Filled 2022-01-02 (×2): qty 0.12

## 2022-01-02 MED ORDER — INSULIN DETEMIR 100 UNIT/ML ~~LOC~~ SOLN
10.0000 [IU] | Freq: Every day | SUBCUTANEOUS | Status: DC
  Administered 2022-01-02: 10 [IU] via SUBCUTANEOUS
  Filled 2022-01-02 (×2): qty 0.1

## 2022-01-02 NOTE — Progress Notes (Signed)
°  Transition of Care [facility]) Screening Note   Patient Details  Name: Seth Hunt Date of Birth: 1933/01/01   Transition of Care [facility]) CM/SW Contact:    Villa Herb, LCSWA Phone Number: 01/02/2022, 10:58 AM    Transition of Care Department [facility]) has reviewed patient and no TOC needs have been identified at this time. We will continue to monitor patient advancement through interdisciplinary progression rounds. If new patient transition needs arise, please place a TOC consult.

## 2022-01-02 NOTE — Plan of Care (Signed)
°  Problem: Acute Rehab PT Goals(only PT should resolve) Goal: Pt Will Ambulate Outcome: Progressing Flowsheets (Taken 01/02/2022 1233) Pt will Ambulate:  > 125 feet  with modified independence  with least restrictive assistive device Goal: Pt/caregiver will Perform Home Exercise Program Outcome: Progressing Flowsheets (Taken 01/02/2022 1233) Pt/caregiver will Perform Home Exercise Program:  For increased strengthening  For improved balance  Independently  12:33 PM, 01/02/22 Mearl Latin PT, DPT Physical Therapist at [facility]

## 2022-01-02 NOTE — Evaluation (Signed)
Physical Therapy Evaluation Patient Details Name: Seth Hunt MRN: [id number] DOB: Mar 22, 1933 Today's Date: 01/02/2022  History of Present Illness  Seth Hunt  is a 88 y.o. male, with past medical history of hypertension, diabetes mellitus, hyperlipidemia, nonischemic cardiomyopathy, PVCs followed by Dr. Graciela Husbands on amiodarone, COPD, patient presents to ED secondary to worsening dyspnea, cough, with a productive phlegm, reports dyspnea with minimal activity, not at rest, it takes him around 30 minutes to recover, he does report progressive phlegm as well, he is with known history of CHF, but denies any orthopnea or lower extremity edema, denies any fever, chills, no recent sick contacts, no chest pain, no nausea, no vomiting, no abdominal pain, he did report episode of syncope earlier in the week where he was not feeling well.   Clinical Impression  Patient does not require assist with bed mobility and demonstrates good sitting balance EOB. Patient with generalized weakness but is able to transfer to standing with RW. He ambulates with slight unsteadiness with RW but without loss of balance. Patient stating 10-15 falls in last 6 months but only uses AD when ambulating outside. Patient educated on using RW upon return home to decrease risk of falling. Patient will benefit from continued skilled physical therapy in hospital and recommended venue below to increase strength, balance, endurance for safe ADLs and gait.        Recommendations for follow up therapy are one component of a multi-disciplinary discharge planning process, led by the attending physician.  Recommendations may be updated based on patient status, additional functional criteria and insurance authorization.  Follow Up Recommendations Home health PT    Assistance Recommended at Discharge Intermittent Supervision/Assistance  Patient can return home with the following  Assistance with cooking/housework;Assist for  transportation;Help with stairs or ramp for entrance    Equipment Recommendations Rolling walker (2 wheels)  Recommendations for Other Services       Functional Status Assessment Patient has had a recent decline in their functional status and demonstrates the ability to make significant improvements in function in a reasonable and predictable amount of time.     Precautions / Restrictions Precautions Precautions: Fall Restrictions Weight Bearing Restrictions: No      Mobility  Bed Mobility Overal bed mobility: Modified Independent                  Transfers Overall transfer level: Modified independent Equipment used: Rolling walker (2 wheels)               General transfer comment: slightly labored    Ambulation/Gait Ambulation/Gait assistance: Min guard, Supervision Gait Distance (Feet): 90 Feet Assistive device: Rolling walker (2 wheels) Gait Pattern/deviations: Decreased stride length, Step-through pattern Gait velocity: decreased     General Gait Details: slighlty slow and labored with minimal unsteadiness with use of RW  Stairs            Wheelchair Mobility    Modified Rankin (Stroke Patients Only)       Balance Overall balance assessment: Needs assistance Sitting-balance support: No upper extremity supported, Feet supported Sitting balance-Leahy Scale: Good Sitting balance - Comments: seated EOB   Standing balance support: Bilateral upper extremity supported, Reliant on assistive device for balance Standing balance-Leahy Scale: Good Standing balance comment: with RW                             Pertinent Vitals/Pain Pain Assessment Pain Assessment: No/denies pain    Home  Living Family/patient expects to be discharged to:: Private residence Living Arrangements: Children Available Help at Discharge: Family Type of Home: Mobile home Home Access: Stairs to enter Entrance Stairs-Rails: Right;Left;Can reach  both Entrance Stairs-Number of Steps: 4   Home Layout: One level Home Equipment: Cane - single point;Grab bars - tub/shower Additional Comments: Frequent falls, states 10-15 falls in last 6 months    Prior Function Prior Level of Function : History of Falls (last six months);Independent/Modified Independent;Needs assist             Mobility Comments: household ambulator with SPC when outside, frequent falls ADLs Comments: independent with basic ADL, granddaughter assists PRN     Hand Dominance        Extremity/Trunk Assessment   Upper Extremity Assessment Upper Extremity Assessment: Defer to OT evaluation    Lower Extremity Assessment Lower Extremity Assessment: Generalized weakness;Overall [facility] for tasks assessed    Cervical / Trunk Assessment Cervical / Trunk Assessment: Normal  Communication   Communication: HOH  Cognition Arousal/Alertness: Awake/alert Behavior During Therapy: WFL for tasks assessed/performed Overall Cognitive Status: Within Functional Limits for tasks assessed                                          General Comments      Exercises     Assessment/Plan    PT Assessment Patient needs continued PT services  PT Problem List Decreased strength;Decreased mobility;Decreased activity tolerance;Decreased balance;Decreased knowledge of use of DME       PT Treatment Interventions DME instruction;Therapeutic exercise;Gait training;Balance training;Stair training;Neuromuscular re-education;Functional mobility training;Therapeutic activities;Patient/family education    PT Goals (Current goals can be found in the Care Plan section)  Acute Rehab PT Goals Patient Stated Goal: return home PT Goal Formulation: With patient Time For Goal Achievement: 01/16/22 Potential to Achieve Goals: Good    Frequency Min 3X/week     Co-evaluation               AM-PAC PT "6 Clicks" Mobility  Outcome Measure Help needed turning from  your back to your side while in a flat bed without using bedrails?: None Help needed moving from lying on your back to sitting on the side of a flat bed without using bedrails?: None Help needed moving to and from a bed to a chair (including a wheelchair)?: A Little Help needed standing up from a chair using your arms (e.g., wheelchair or bedside chair)?: A Little Help needed to walk in hospital room?: A Little Help needed climbing 3-5 steps with a railing? : A Lot 6 Click Score: 19    End of Session Equipment Utilized During Treatment: Gait belt Activity Tolerance: Patient tolerated treatment well Patient left: in bed;with call bell/phone within reach;with bed alarm set Nurse Communication: Mobility status PT Visit Diagnosis: Unsteadiness on feet (R26.81);Other abnormalities of gait and mobility (R26.89);History of falling (Z91.81);Muscle weakness (generalized) (M62.81)    Time: 5366-4403 PT Time Calculation (min) (ACUTE ONLY): 16 min   Charges:   PT Evaluation $PT Eval Low Complexity: 1 Low PT Treatments $Therapeutic Activity: 8-22 mins        12:25 PM, 01/02/22 Wyman Songster PT, DPT Physical Therapist at [facility]

## 2022-01-02 NOTE — Progress Notes (Signed)
Patient has a congested cough, maintained his O2 sats in the high 90s on room air and has been complaint free during the night.

## 2022-01-02 NOTE — Progress Notes (Signed)
PROGRESS NOTE    Seth Hunt  [id number] DOB: 1933-04-26 DOA: 01/01/2022 PCP: Evelene Croon, MD    Chief Complaint  Patient presents with   Shortness of Breath   Nasal Congestion    Brief Narrative:  [facility] H&P By Dr. Waldron Labs on 01/01/2022 Seth Hunt  is a 88 y.o. male, with past medical history of hypertension, diabetes mellitus, hyperlipidemia, nonischemic cardiomyopathy, PVCs followed by Dr. Caryl Comes on amiodarone and COPD, patient presented to ED secondary to worsening dyspnea, cough, with a productive phlegm, reports dyspnea with minimal activity, not at rest, it takes him around 30 minutes to recover, he does report progressive phlegm as well, he is with known history of CHF, but denies any orthopnea or lower extremity edema, denies any fever, chills, no recent sick contacts, no chest pain, no nausea, no vomiting, no abdominal pain. Patient also reported episode of syncope earlier in the week where he was not feeling well. -in ED chest x-ray significant for right lower lobe pneumonia, he was hypoxic with activity (at home), and Cr 1.69 which is his baseline, glucose at 170, albumin at 2.8, hemoglobin at 10.4, BNP elevated at 306, but he has no edema, no JVD and no vascular congestion on imaging, significantly improved after receiving nebs and Solu-Medrol, [facility] hospitalist consulted to admit.  Assessment & Plan: 1-COPD exacerbation ([place]) -In the setting of right lower lobe pneumonia -Continue antibiotics, steroids, nebulizer management, mucolytic's and supportive care. -Will assess desaturation screening, no requiring oxygen at rest currently.  2-chronic diastolic and systolic CHF -Currently compensated -Last ejection fraction 40% -No complaining of orthopnea, lower extremity edema or having crackles on exam. -Continue to follow daily weights and low-sodium diet  3-type 2 diabetes mellitus with nephropathy -Participating elevated CBGs while receiving the  steroids -Continue sliding scale insulin and Levemir -Holding oral hypoglycemic agents.  4-HTN -continue current antihypertensive agents -BP stable.  5-CKD stage 3b -stable overall -Minimize nephrotoxic agents as much as possible, avoid hypotension and maintain adequate hydration. -Continue to follow renal function trend/stability.  6-syncope -Follow echo results -Continue telemetry monitoring -Reports no lightheadedness on further syncopal events.  7-history of multiple PVCs/nonischemic cardiomyopathy continue outpatient follow-up with cardiology -Continue the use of amiodarone -Continue telemetry monitoring.   DVT prophylaxis: Heparin Code Status: Full code Family Communication: No family at bedside. Disposition:   Status is: Inpatient   Consultants:  None   Procedures:  See below for x-ray reports 2D echo: Pending  Antimicrobials:  Cefdinir   Subjective: Reports feeling slightly better; no chest pain, no nausea or vomiting.  Generally weak.  Short winded with activity, intermittent coughing spells and having difficulty speaking in full sentences.  At rest no requiring oxygen supplementation.  Objective: Vitals:   01/02/22 1222 01/02/22 1224 01/02/22 1425 01/02/22 1505  BP:   (!) 102/42 (!) 109/56  Pulse:   74 77  Resp:   20   Temp:   98.3 F (36.8 C)   TempSrc:   Oral   SpO2: 100% 100% 100% 95%  Weight:      Height:        Intake/Output Summary (Last 24 hours) at 01/02/2022 1925 Last data filed at 01/02/2022 0500 Gross per 24 hour  Intake 360 ml  Output 100 ml  Net 260 ml   Filed Weights   01/01/22 1653 01/01/22 2118  Weight: 72.6 kg 67.7 kg    Examination:  General exam: Having difficulty speaking in full sentences; expressed to be short winded with activity.  No  chest pain, no nausea or vomiting.  Reports no further episodes of lightheadedness or syncope. Respiratory system: Diffuse expiratory wheezing, no using accessory muscle.  Positive  rhonchi.  Intermittent coughing spells.  No requiring oxygen supplementation at rest. Cardiovascular system: Rate controlled, no rubs, no gallops, no JVD on exam. Gastrointestinal system: Abdomen is nondistended, soft and nontender. No organomegaly or masses felt. Normal bowel sounds heard. Central nervous system: Alert and oriented. No focal neurological deficits.  Generally weak. Extremities: No cyanosis or clubbing. Skin: No petechiae. Psychiatry: Judgement and insight appear normal. Mood & affect appropriate.     Data Reviewed: I have personally reviewed following labs and imaging studies  CBC: Recent Labs  Lab 01/01/22 1715 01/02/22 0407  WBC 8.1 6.7  NEUTROABS 6.5  --   HGB 10.4* 9.9*  HCT 32.0* 30.4*  MCV 86.5 86.6  PLT 168 [id number]    Basic Metabolic Panel: Recent Labs  Lab 01/01/22 1715 01/02/22 0407  NA 135 135  K 3.6 3.5  CL 99 97*  CO2 24 25  GLUCOSE 170* 296*  BUN 35* 37*  CREATININE 1.69* 1.78*  CALCIUM 8.6* 8.8*    GFR: Estimated Creatinine Clearance: 28.5 mL/min (A) (by C-G formula based on SCr of 1.78 mg/dL (H)).  Liver Function Tests: Recent Labs  Lab 01/01/22 1715  AST 17  ALT 12  ALKPHOS 65  BILITOT 1.1  PROT 6.9  ALBUMIN 2.8*    CBG: Recent Labs  Lab 01/01/22 2126 01/02/22 0724 01/02/22 1153 01/02/22 1655  GLUCAP 217* 328* 379* 352*     Recent Results (from the past 240 hour(s))  Resp Panel by RT-PCR (Flu A&B, Covid) Nasopharyngeal Swab     Status: None   Collection Time: 01/01/22  5:08 PM   Specimen: Nasopharyngeal Swab; Nasopharyngeal(NP) swabs in vial transport medium  Result Value Ref Range Status   SARS Coronavirus 2 by RT PCR NEGATIVE NEGATIVE Final    Comment: (NOTE) SARS-CoV-2 target nucleic acids are NOT DETECTED.  The SARS-CoV-2 RNA is generally detectable in upper respiratory specimens during the acute phase of infection. The lowest concentration of SARS-CoV-2 viral copies this assay can detect is 138 copies/mL.  A negative result does not preclude SARS-Cov-2 infection and should not be used as the sole basis for treatment or other patient management decisions. A negative result may occur with  improper specimen collection/handling, submission of specimen other than nasopharyngeal swab, presence of viral mutation(s) within the areas targeted by this assay, and inadequate number of viral copies(<138 copies/mL). A negative result must be combined with clinical observations, patient history, and epidemiological information. The expected result is Negative.  Fact Sheet for Patients:  EntrepreneurPulse.com.au  Fact Sheet for Healthcare Providers:  IncredibleEmployment.be  This test is no t yet approved or cleared by the [place] FDA and  has been authorized for detection and/or diagnosis of SARS-CoV-2 by FDA under an Emergency Use Authorization (EUA). This EUA will remain  in effect (meaning this test can be used) for the duration of the COVID-19 declaration under Section 564(b)(1) of the Act, 21 U.S.C.section 360bbb-3(b)(1), unless the authorization is terminated  or revoked sooner.       Influenza A by PCR NEGATIVE NEGATIVE Final   Influenza B by PCR NEGATIVE NEGATIVE Final    Comment: (NOTE) The Xpert Xpress SARS-CoV-2/FLU/RSV plus assay is intended as an aid in the diagnosis of influenza from Nasopharyngeal swab specimens and should not be used as a sole basis for treatment. Nasal washings and aspirates  are unacceptable for Xpert Xpress SARS-CoV-2/FLU/RSV testing.  Fact Sheet for Patients: EntrepreneurPulse.com.au  Fact Sheet for Healthcare Providers: IncredibleEmployment.be  This test is not yet approved or cleared by the [place] FDA and has been authorized for detection and/or diagnosis of SARS-CoV-2 by FDA under an Emergency Use Authorization (EUA). This EUA will remain in effect (meaning this test  can be used) for the duration of the COVID-19 declaration under Section 564(b)(1) of the Act, 21 U.S.C. section 360bbb-3(b)(1), unless the authorization is terminated or revoked.  Performed at [facility], [address] [place], [place] [address]      Radiology Studies: DG Chest 2 View  Result Date: 01/01/2022 CLINICAL DATA:  Short of breath EXAM: CHEST - 2 VIEW COMPARISON:  03/14/2020 FINDINGS: Normal cardiac silhouette. Fine airspace disease in the RIGHT lower lobe. Hyperinflated lungs. Degenerative osteophytosis of the spine. IMPRESSION: RIGHT lower lobe pneumonia. Followup PA and lateral chest X-ray is recommended in 3-4 weeks following trial of antibiotic therapy to ensure resolution and exclude underlying malignancy. Electronically Signed   By: Suzy Bouchard M.D.   On: 01/01/2022 17:40    Scheduled Meds:  arformoterol  15 mcg Nebulization BID   budesonide (PULMICORT) nebulizer solution  0.5 mg Nebulization BID   cefdinir  300 mg Oral Daily   guaiFENesin  600 mg Oral BID   heparin  5,000 Units Subcutaneous Q8H   [START ON 01/03/2022] insulin aspart  0-15 Units Subcutaneous TID WC   insulin aspart  0-5 Units Subcutaneous QHS   [START ON 01/03/2022] insulin detemir  12 Units Subcutaneous Daily   ipratropium-albuterol  3 mL Nebulization QID   methylPREDNISolone (SOLU-MEDROL) injection  40 mg Intravenous Q12H   Continuous Infusions:   LOS: 1 day    Barton Dubois, MD [facility]   To contact the attending provider between 7A-7P or the covering provider during after hours 7P-7A, please log into the web site [email or web address] and access using universal [place] password for that web site. If you do not have the password, please call the hospital operator.  01/02/2022, 7:25 PM

## 2022-01-03 ENCOUNTER — Other Ambulatory Visit (HOSPITAL_COMMUNITY): Payer: Self-pay | Admitting: *Deleted

## 2022-01-03 ENCOUNTER — Inpatient Hospital Stay (HOSPITAL_COMMUNITY): Payer: Medicare Other

## 2022-01-03 DIAGNOSIS — N1832 Chronic kidney disease, stage 3b: Secondary | ICD-10-CM

## 2022-01-03 DIAGNOSIS — J189 Pneumonia, unspecified organism: Secondary | ICD-10-CM

## 2022-01-03 DIAGNOSIS — J9601 Acute respiratory failure with hypoxia: Secondary | ICD-10-CM

## 2022-01-03 DIAGNOSIS — I5022 Chronic systolic (congestive) heart failure: Secondary | ICD-10-CM

## 2022-01-03 DIAGNOSIS — R55 Syncope and collapse: Secondary | ICD-10-CM

## 2022-01-03 DIAGNOSIS — K219 Gastro-esophageal reflux disease without esophagitis: Secondary | ICD-10-CM

## 2022-01-03 DIAGNOSIS — R5381 Other malaise: Secondary | ICD-10-CM

## 2022-01-03 LAB — GLUCOSE, CAPILLARY
Glucose-Capillary: 354 mg/dL — ABNORMAL HIGH (ref 70–99)
Glucose-Capillary: 270 mg/dL — ABNORMAL HIGH (ref 70–99)
Glucose-Capillary: 162 mg/dL — ABNORMAL HIGH (ref 70–99)

## 2022-01-03 LAB — ECHOCARDIOGRAM COMPLETE
Area-P 1/2: 3.12 cm2
Height: 69 in
S' Lateral: 4.1 cm
Weight: 2388.02 oz

## 2022-01-03 LAB — HEMOGLOBIN A1C
Hgb A1c MFr Bld: 7.5 % — ABNORMAL HIGH (ref 4.8–5.6)
Mean Plasma Glucose: 169 mg/dL

## 2022-01-03 MED ORDER — PANTOPRAZOLE SODIUM 40 MG PO TBEC
40.0000 mg | DELAYED_RELEASE_TABLET | Freq: Every day | ORAL | 1 refills | Status: AC
Start: 2022-01-03 — End: 2023-01-03

## 2022-01-03 MED ORDER — ALBUTEROL SULFATE HFA 108 (90 BASE) MCG/ACT IN AERS
1.0000 | INHALATION_SPRAY | Freq: Four times a day (QID) | RESPIRATORY_TRACT | 2 refills | Status: AC | PRN
Start: 2022-01-03 — End: ?

## 2022-01-03 MED ORDER — CEFDINIR 300 MG PO CAPS: 300.0000 mg | ORAL_CAPSULE | Freq: Every day | ORAL | 0 refills | Status: AC

## 2022-01-03 MED ORDER — PREDNISONE 20 MG PO TABS: ORAL_TABLET | ORAL | 0 refills | Status: DC

## 2022-01-03 MED ORDER — IPRATROPIUM-ALBUTEROL 0.5-2.5 (3) MG/3ML IN SOLN
3.0000 mL | Freq: Three times a day (TID) | RESPIRATORY_TRACT | Status: DC
  Administered 2022-01-03: 3 mL via RESPIRATORY_TRACT
  Filled 2022-01-03: qty 3

## 2022-01-03 MED ORDER — ADVAIR HFA 230-21 MCG/ACT IN AERO: 2.0000 | INHALATION_SPRAY | Freq: Two times a day (BID) | RESPIRATORY_TRACT | 3 refills | Status: AC

## 2022-01-03 MED ORDER — GUAIFENESIN ER 600 MG PO TB12: 600.0000 mg | ORAL_TABLET | Freq: Two times a day (BID) | ORAL | 0 refills | Status: DC

## 2022-01-03 NOTE — Discharge Summary (Signed)
Physician Discharge Summary  Seth Hunt [id number] DOB: 07/17/1933 DOA: 01/01/2022  PCP: Evelene Croon, MD  Admit date: 01/01/2022 Discharge date: 01/03/2022  Time spent: 35 minutes  Recommendations for Outpatient Follow-up:  Repeat basic metabolic panel to follow twice renal function. Repeat chest x-ray at 6-8 weeks to assure resolution of infiltrates. Reassess blood pressure and adjust antihypertensive treatment as needed. Follow final results of 2D echo and make sure patient has follow-up with cardiology service as instructed.   Discharge Diagnoses:  Principal Problem:   COPD exacerbation ([place]) Active Problems:   COPD (chronic obstructive pulmonary disease) (HCC)   DM (diabetes mellitus) (HCC)   HTN (hypertension)   Acute respiratory failure with hypoxia ([place])   Community acquired pneumonia of right lower lobe of lung   Chronic systolic HF (heart failure) (HCC)   Stage 3b chronic kidney disease (CKD) ([place])   Gastroesophageal reflux disease   Physical deconditioning   Discharge Condition: Stable and improved.  Discharged home with instruction to follow-up with PCP and cardiology service.  CODE STATUS: Full code.  Diet recommendation: Heart healthy/modified carbohydrate diet.  Filed Weights   01/01/22 1653 01/01/22 2118  Weight: 72.6 kg 67.7 kg    History of present illness:  As per H&P By Dr. Waldron Labs on 01/01/2022 Seth Hunt  is a 88 y.o. male, with past medical history of hypertension, diabetes mellitus, hyperlipidemia, nonischemic cardiomyopathy, PVCs followed by Dr. Caryl Comes on amiodarone and COPD, patient presented to ED secondary to worsening dyspnea, cough, with a productive phlegm, reports dyspnea with minimal activity, not at rest, it takes him around 30 minutes to recover, he does report progressive phlegm as well, he is with known history of CHF, but denies any orthopnea or lower extremity edema, denies any fever, chills, no recent sick contacts, no  chest pain, no nausea, no vomiting, no abdominal pain. Patient also reported episode of syncope earlier in the week where he was not feeling well. -in ED chest x-ray significant for right lower lobe pneumonia, he was hypoxic with activity (at home), and Cr 1.69 which is his baseline, glucose at 170, albumin at 2.8, hemoglobin at 10.4, BNP elevated at 306, but he has no edema, no JVD and no vascular congestion on imaging, significantly improved after receiving nebs and Solu-Medrol, [facility] hospitalist consulted to admit.  Hospital Course:  1-COPD exacerbation ([place]) -In the setting of right lower lobe pneumonia -Continue antibiotics, steroids tapering, mucolytic's and bronchodilator management -Desaturation screening demonstrated no need for oxygen supplementation.  (Good saturation at rest and during activity). -We will recommend outpatient follow-up with pulmonology for PFTs.   2-chronic diastolic and systolic CHF -Currently compensated -Last ejection fraction 40% -No complaining of orthopnea, lower extremity edema or having crackles on exam. -Continue to follow daily weights and low-sodium diet -Continue home cardiac medications regimen and outpatient follow-up with cardiology service.   3-type 2 diabetes mellitus with nephropathy -A1c 7.2 -Advised to follow modified carbohydrate diet -Instructed to maintain adequate hydration admission and oral hypoglycemic agents.   4-HTN -continue current antihypertensive agents -BP stable. -Healthy diet encouraged.   5-CKD stage 3b -stable overall -Continue to minimize nephrotoxic agents as much as possible, avoid hypotension and maintain adequate hydration. -Repeat basic metabolic panel follow-up visit to reassess renal function.   6-syncope -2D echo has been done and results pending -Patient remains asymptomatic -No telemetry abnormalities, no further lightheadedness or syncope events.   -Feels safe and ready to go home. -Outpatient  follow-up with cardiology recommended.  7-history of multiple  PVCs/nonischemic cardiomyopathy continue outpatient follow-up with cardiology -Continue the use of amiodarone -Denies any palpitations -Follow-up 2D echo results on his outpatient visit.  8-physical deconditioning -Patient has been seen by physical therapy with recommendation for home health PT -TOC has help arranging services at discharge.  Procedures: See below for x-ray report.  Consultations: None  Discharge Exam: Vitals:   01/03/22 0811 01/03/22 0815  BP:    Pulse: 72   Resp: (!) 22   Temp:    SpO2: 94% 94%    General: Afebrile, no chest pain, no nausea, no vomiting.  Feeling improved, no requiring oxygen supplementation unable to speak in full sentences.  Patient denies lightheadedness or syncope. Cardiovascular: Rate controlled, no rubs, no gallops, no JVD on exam. Respiratory: Improved air movement bilaterally; positive scattered rhonchi (right more than left), no using accessory muscle.  Good saturation on room air. Abdomen: Soft, nontender, positive bowel sounds Extremities: No cyanosis or clubbing.  Discharge Instructions   Discharge Instructions     (HEART FAILURE PATIENTS) Call MD:  Anytime you have any of the following symptoms: 1) 3 pound weight gain in 24 hours or 5 pounds in 1 week 2) shortness of breath, with or without a dry hacking cough 3) swelling in the hands, feet or stomach 4) if you have to sleep on extra pillows at night in order to breathe.   Complete by: As directed    Diet - low sodium heart healthy   Complete by: As directed    Discharge instructions   Complete by: As directed    Continue to maintain adequate hydration Follow instructions from home health services to increase strengthening/conditioning. Follow-up with cardiology service as previously instructed as an outpatient Continue to check your weight on daily basis and follow low-sodium diet (less than 2.2 g daily). Take  medications are prescribed      Allergies as of 01/03/2022   No Known Allergies      Medication List     STOP taking these medications    ramipril 10 MG capsule Commonly known as: ALTACE       TAKE these medications    Advair HFA 230-21 MCG/ACT inhaler Generic drug: fluticasone-salmeterol Inhale 2 puffs into the lungs 2 (two) times daily.   albuterol 108 (90 Base) MCG/ACT inhaler Commonly known as: VENTOLIN HFA Inhale 1-2 puffs into the lungs every 6 (six) hours as needed for wheezing or shortness of breath.   amiodarone 200 MG tablet Commonly known as: PACERONE Take 0.5 tablets (100 mg total) by mouth daily.   aspirin EC 81 MG tablet Take 81 mg by mouth daily.   cefdinir 300 MG capsule Commonly known as: OMNICEF Take 1 capsule (300 mg total) by mouth daily for 7 days. Start taking on: January 04, 2022   FLUoxetine 20 MG capsule Commonly known as: PROZAC Take 20 mg by mouth every morning.   furosemide 40 MG tablet Commonly known as: LASIX Take 1 tablet (40 mg total) by mouth daily.   glimepiride 4 MG tablet Commonly known as: AMARYL Take 4 mg by mouth daily.   guaiFENesin 600 MG 12 hr tablet Commonly known as: MUCINEX Take 1 tablet (600 mg total) by mouth 2 (two) times daily.   losartan 25 MG tablet Commonly known as: COZAAR Take 1 tablet (25 mg total) by mouth daily.   montelukast 10 MG tablet Commonly known as: SINGULAIR Take 10 mg by mouth daily.   pantoprazole 40 MG tablet Commonly known as: Protonix Take 1  tablet (40 mg total) by mouth daily.   predniSONE 20 MG tablet Commonly known as: DELTASONE Take 3 tablets by mouth daily x1 day; then 2 tablet by mouth daily x2 days; then 1 tablet by mouth daily x3 days; then half tablet by mouth daily x3 days and stop prednisone.   simvastatin 10 MG tablet Commonly known as: ZOCOR Take 1 tablet (10 mg total) by mouth at bedtime.   tamsulosin 0.4 MG Caps capsule Commonly known as: FLOMAX Take 0.4  mg by mouth every morning.       No Known Allergies  Follow-up Information     Cordial, Launa Grill, MD. Schedule an appointment as soon as possible for a visit .   Contact information: [address] [place] [address] [phone]         Deboraha Sprang, MD. Schedule an appointment as soon as possible for a visit .   Specialty: Cardiology Contact information: [id number] [place] [address] [phone]         Evelene Croon, MD. Schedule an appointment as soon as possible for a visit in 10 day(s).   Contact information: [address] [place] [address] [phone]                The results of significant diagnostics from this hospitalization (including imaging, microbiology, ancillary and laboratory) are listed below for reference.    Significant Diagnostic Studies: DG Chest 2 View  Result Date: 01/01/2022 CLINICAL DATA:  Short of breath EXAM: CHEST - 2 VIEW COMPARISON:  03/14/2020 FINDINGS: Normal cardiac silhouette. Fine airspace disease in the RIGHT lower lobe. Hyperinflated lungs. Degenerative osteophytosis of the spine. IMPRESSION: RIGHT lower lobe pneumonia. Followup PA and lateral chest X-ray is recommended in 3-4 weeks following trial of antibiotic therapy to ensure resolution and exclude underlying malignancy. Electronically Signed   By: Suzy Bouchard M.D.   On: 01/01/2022 17:40    Microbiology: Recent Results (from the past 240 hour(s))  Resp Panel by RT-PCR (Flu A&B, Covid) Nasopharyngeal Swab     Status: None   Collection Time: 01/01/22  5:08 PM   Specimen: Nasopharyngeal Swab; Nasopharyngeal(NP) swabs in vial transport medium  Result Value Ref Range Status   SARS Coronavirus 2 by RT PCR NEGATIVE NEGATIVE Final    Comment: (NOTE) SARS-CoV-2 target nucleic acids are NOT DETECTED.  The SARS-CoV-2 RNA is generally detectable in upper respiratory specimens during the acute phase of infection. The  lowest concentration of SARS-CoV-2 viral copies this assay can detect is 138 copies/mL. A negative result does not preclude SARS-Cov-2 infection and should not be used as the sole basis for treatment or other patient management decisions. A negative result may occur with  improper specimen collection/handling, submission of specimen other than nasopharyngeal swab, presence of viral mutation(s) within the areas targeted by this assay, and inadequate number of viral copies(<138 copies/mL). A negative result must be combined with clinical observations, patient history, and epidemiological information. The expected result is Negative.  Fact Sheet for Patients:  EntrepreneurPulse.com.au  Fact Sheet for Healthcare Providers:  IncredibleEmployment.be  This test is no t yet approved or cleared by the [place] FDA and  has been authorized for detection and/or diagnosis of SARS-CoV-2 by FDA under an Emergency Use Authorization (EUA). This EUA will remain  in effect (meaning this test can be used) for the duration of the COVID-19 declaration under Section 564(b)(1) of the Act, 21 U.S.C.section 360bbb-3(b)(1), unless the authorization is  terminated  or revoked sooner.       Influenza A by PCR NEGATIVE NEGATIVE Final   Influenza B by PCR NEGATIVE NEGATIVE Final    Comment: (NOTE) The Xpert Xpress SARS-CoV-2/FLU/RSV plus assay is intended as an aid in the diagnosis of influenza from Nasopharyngeal swab specimens and should not be used as a sole basis for treatment. Nasal washings and aspirates are unacceptable for Xpert Xpress SARS-CoV-2/FLU/RSV testing.  Fact Sheet for Patients: EntrepreneurPulse.com.au  Fact Sheet for Healthcare Providers: IncredibleEmployment.be  This test is not yet approved or cleared by the [place] FDA and has been authorized for detection and/or diagnosis of SARS-CoV-2 by FDA under  an Emergency Use Authorization (EUA). This EUA will remain in effect (meaning this test can be used) for the duration of the COVID-19 declaration under Section 564(b)(1) of the Act, 21 U.S.C. section 360bbb-3(b)(1), unless the authorization is terminated or revoked.  Performed at [facility], [address], [place], [place] [address]      Labs: Basic Metabolic Panel: Recent Labs  Lab 01/01/22 1715 01/02/22 0407  NA 135 135  K 3.6 3.5  CL 99 97*  CO2 24 25  GLUCOSE 170* 296*  BUN 35* 37*  CREATININE 1.69* 1.78*  CALCIUM 8.6* 8.8*   Liver Function Tests: Recent Labs  Lab 01/01/22 1715  AST 17  ALT 12  ALKPHOS 65  BILITOT 1.1  PROT 6.9  ALBUMIN 2.8*   CBC: Recent Labs  Lab 01/01/22 1715 01/02/22 0407  WBC 8.1 6.7  NEUTROABS 6.5  --   HGB 10.4* 9.9*  HCT 32.0* 30.4*  MCV 86.5 86.6  PLT 168 161   BNP (last 3 results) Recent Labs    01/01/22 1715  BNP 306.0*   CBG: Recent Labs  Lab 01/02/22 1153 01/02/22 1655 01/02/22 2116 01/03/22 0710 01/03/22 1111  GLUCAP 379* 352* 296* 354* 270*   Signed:  Barton Dubois MD.  [facility] 01/03/2022, 2:15 PM

## 2022-01-03 NOTE — Plan of Care (Signed)
°  Problem: Activity: Goal: Ability to tolerate increased activity will improve Outcome: Adequate for Discharge   Problem: Clinical Measurements: Goal: Ability to maintain a body temperature in the normal range will improve Outcome: Adequate for Discharge   Problem: Respiratory: Goal: Ability to maintain adequate ventilation will improve Outcome: Adequate for Discharge Goal: Ability to maintain a clear airway will improve Outcome: Adequate for Discharge   Problem: Activity: Goal: Ability to tolerate increased activity will improve Outcome: Adequate for Discharge   Problem: Clinical Measurements: Goal: Ability to maintain a body temperature in the normal range will improve Outcome: Adequate for Discharge   Problem: Respiratory: Goal: Ability to maintain adequate ventilation will improve Outcome: Adequate for Discharge Goal: Ability to maintain a clear airway will improve Outcome: Adequate for Discharge   Problem: Education: Goal: Knowledge of General Education information will improve Description: Including pain rating scale, medication(s)/side effects and non-pharmacologic comfort measures Outcome: Adequate for Discharge   Problem: Health Behavior/Discharge Planning: Goal: Ability to manage health-related needs will improve Outcome: Adequate for Discharge   Problem: Clinical Measurements: Goal: Ability to maintain clinical measurements within normal limits will improve Outcome: Adequate for Discharge Goal: Will remain free from infection Outcome: Adequate for Discharge Goal: Diagnostic test results will improve Outcome: Adequate for Discharge Goal: Respiratory complications will improve Outcome: Adequate for Discharge Goal: Cardiovascular complication will be avoided Outcome: Adequate for Discharge   Problem: Activity: Goal: Risk for activity intolerance will decrease Outcome: Adequate for Discharge   Problem: Nutrition: Goal: Adequate nutrition will be  maintained Outcome: Adequate for Discharge   Problem: Coping: Goal: Level of anxiety will decrease Outcome: Adequate for Discharge   Problem: Elimination: Goal: Will not experience complications related to bowel motility Outcome: Adequate for Discharge Goal: Will not experience complications related to urinary retention Outcome: Adequate for Discharge   Problem: Pain Managment: Goal: General experience of comfort will improve Outcome: Adequate for Discharge   Problem: Safety: Goal: Ability to remain free from injury will improve Outcome: Adequate for Discharge   Problem: Skin Integrity: Goal: Risk for impaired skin integrity will decrease Outcome: Adequate for Discharge

## 2022-01-03 NOTE — Progress Notes (Signed)
Vital Signs stable, rested throughout the night. No complaints at this time.

## 2022-01-03 NOTE — Progress Notes (Signed)
*  PRELIMINARY RESULTS* Echocardiogram 2D Echocardiogram has been performed.  Seth Hunt 01/03/2022, 2:08 PM

## 2022-01-03 NOTE — TOC Progression Note (Signed)
Transition of Care [facility]) - Progression Note    Patient Details  Name: Daily Crate MRN: [id number] Date of Birth: 01-Jan-1933  [facility]) CM/SW Contact  Elliot Gurney [place], [place] Phone Number: [phone] 01/03/2022, 1:03 PM  Clinical Narrative:    This Education officer, museum met with patient at discharge to discuss discharge recommendations for [facility] PT and a walker. Patient agreeable to [facility] PT and has no preferences regarding agency used. Patient resides with his great granddauther Valinda Party who will provide transport home. Patient's PCP is Alphia Kava in [place]. Corene Cornea from [facility] has accepted patient for [place] from [facility] accepts referral for walker and will have walker delivered to patient's room before discharge. Patient's great granddaughter contacted, VM left requesting a return call.  Transition of Care to continue to follow  [facility], LCSW Transition of Call [phone]     Expected Discharge Plan: [place] Barriers to Discharge: No Barriers Identified  Expected Discharge Plan and Services Expected Discharge Plan: [place] In-house Referral: Clinical Social Work   Post Acute Care Choice: Home Health, Durable Medical Equipment Living arrangements for the past 2 months: Mobile Home                 DME Arranged: Walker rolling DME Agency: [employer] Date DME Agency Contacted: 01/03/22 Time DME Agency Contacted: 1253 Representative spoke with at DME Agency: [place]: PT [place]: [place] ([place]) Date [place]: 01/03/22 Time [place]: 1254 Representative spoke with at [place]: [place] ([place]) Interventions    Readmission Risk Interventions No flowsheet data found.

## 2022-01-05 LAB — HEMOGLOBIN A1C
Hgb A1c MFr Bld: 7.8 % — ABNORMAL HIGH (ref 4.8–5.6)
Mean Plasma Glucose: 177 mg/dL

## 2022-03-31 ENCOUNTER — Emergency Department (HOSPITAL_COMMUNITY): Payer: Medicare Other

## 2022-03-31 ENCOUNTER — Encounter (HOSPITAL_COMMUNITY): Payer: Self-pay | Admitting: Emergency Medicine

## 2022-03-31 ENCOUNTER — Other Ambulatory Visit: Payer: Self-pay

## 2022-03-31 ENCOUNTER — Inpatient Hospital Stay (HOSPITAL_COMMUNITY)
Admission: EM | Admit: 2022-03-31 | Discharge: 2022-04-02 | DRG: 291 | Disposition: A | Discharge status: Home / Self Care | Payer: Medicare Other | Attending: Internal Medicine | Admitting: Internal Medicine

## 2022-03-31 DIAGNOSIS — J432 Centrilobular emphysema: Secondary | ICD-10-CM | POA: Diagnosis present

## 2022-03-31 DIAGNOSIS — Z7982 Long term (current) use of aspirin: Secondary | ICD-10-CM

## 2022-03-31 DIAGNOSIS — R5381 Other malaise: Secondary | ICD-10-CM

## 2022-03-31 DIAGNOSIS — J449 Chronic obstructive pulmonary disease, unspecified: Secondary | ICD-10-CM | POA: Diagnosis present

## 2022-03-31 DIAGNOSIS — I959 Hypotension, unspecified: Secondary | ICD-10-CM | POA: Diagnosis present

## 2022-03-31 DIAGNOSIS — R079 Chest pain, unspecified: Secondary | ICD-10-CM | POA: Diagnosis present

## 2022-03-31 DIAGNOSIS — I169 Hypertensive crisis, unspecified: Secondary | ICD-10-CM

## 2022-03-31 DIAGNOSIS — R351 Nocturia: Secondary | ICD-10-CM | POA: Diagnosis present

## 2022-03-31 DIAGNOSIS — Z9049 Acquired absence of other specified parts of digestive tract: Secondary | ICD-10-CM

## 2022-03-31 DIAGNOSIS — I4891 Unspecified atrial fibrillation: Secondary | ICD-10-CM | POA: Diagnosis present

## 2022-03-31 DIAGNOSIS — N1832 Chronic kidney disease, stage 3b: Secondary | ICD-10-CM

## 2022-03-31 DIAGNOSIS — I429 Cardiomyopathy, unspecified: Secondary | ICD-10-CM | POA: Diagnosis present

## 2022-03-31 DIAGNOSIS — E1165 Type 2 diabetes mellitus with hyperglycemia: Secondary | ICD-10-CM | POA: Diagnosis present

## 2022-03-31 DIAGNOSIS — Z20822 Contact with and (suspected) exposure to covid-19: Secondary | ICD-10-CM | POA: Diagnosis present

## 2022-03-31 DIAGNOSIS — Z833 Family history of diabetes mellitus: Secondary | ICD-10-CM

## 2022-03-31 DIAGNOSIS — J9601 Acute respiratory failure with hypoxia: Secondary | ICD-10-CM | POA: Diagnosis present

## 2022-03-31 DIAGNOSIS — I252 Old myocardial infarction: Secondary | ICD-10-CM

## 2022-03-31 DIAGNOSIS — I5022 Chronic systolic (congestive) heart failure: Principal | ICD-10-CM | POA: Diagnosis present

## 2022-03-31 DIAGNOSIS — Z28310 Unvaccinated for covid-19: Secondary | ICD-10-CM

## 2022-03-31 DIAGNOSIS — Z91141 Patient's other noncompliance with medication regimen due to financial hardship: Secondary | ICD-10-CM

## 2022-03-31 DIAGNOSIS — R001 Bradycardia, unspecified: Secondary | ICD-10-CM | POA: Diagnosis present

## 2022-03-31 DIAGNOSIS — N401 Enlarged prostate with lower urinary tract symptoms: Secondary | ICD-10-CM | POA: Diagnosis present

## 2022-03-31 DIAGNOSIS — Z7951 Long term (current) use of inhaled steroids: Secondary | ICD-10-CM

## 2022-03-31 DIAGNOSIS — I2510 Atherosclerotic heart disease of native coronary artery without angina pectoris: Secondary | ICD-10-CM | POA: Diagnosis present

## 2022-03-31 DIAGNOSIS — Z79899 Other long term (current) drug therapy: Secondary | ICD-10-CM

## 2022-03-31 DIAGNOSIS — E119 Type 2 diabetes mellitus without complications: Secondary | ICD-10-CM

## 2022-03-31 DIAGNOSIS — Z87891 Personal history of nicotine dependence: Secondary | ICD-10-CM

## 2022-03-31 DIAGNOSIS — Z8249 Family history of ischemic heart disease and other diseases of the circulatory system: Secondary | ICD-10-CM

## 2022-03-31 DIAGNOSIS — I5023 Acute on chronic systolic (congestive) heart failure: Secondary | ICD-10-CM | POA: Diagnosis present

## 2022-03-31 DIAGNOSIS — I493 Ventricular premature depolarization: Secondary | ICD-10-CM | POA: Diagnosis present

## 2022-03-31 DIAGNOSIS — R7989 Other specified abnormal findings of blood chemistry: Secondary | ICD-10-CM | POA: Diagnosis present

## 2022-03-31 DIAGNOSIS — I110 Hypertensive heart disease with heart failure: Principal | ICD-10-CM | POA: Diagnosis present

## 2022-03-31 DIAGNOSIS — R778 Other specified abnormalities of plasma proteins: Secondary | ICD-10-CM | POA: Diagnosis present

## 2022-03-31 DIAGNOSIS — K219 Gastro-esophageal reflux disease without esophagitis: Secondary | ICD-10-CM | POA: Diagnosis present

## 2022-03-31 LAB — DIFFERENTIAL
Abs Immature Granulocytes: 0.02 10*3/uL (ref 0.00–0.07)
Basophils Absolute: 0 10*3/uL (ref 0.0–0.1)
Basophils Relative: 1 %
Eosinophils Absolute: 0.1 10*3/uL (ref 0.0–0.5)
Eosinophils Relative: 1 %
Immature Granulocytes: 0 %
Lymphocytes Relative: 28 %
Lymphs Abs: 1.6 10*3/uL (ref 0.7–4.0)
Monocytes Absolute: 0.3 10*3/uL (ref 0.1–1.0)
Monocytes Relative: 6 %
Neutro Abs: 3.6 10*3/uL (ref 1.7–7.7)
Neutrophils Relative %: 64 %

## 2022-03-31 LAB — BASIC METABOLIC PANEL
Anion gap: 7 (ref 5–15)
BUN: 19 mg/dL (ref 8–23)
CO2: 31 mmol/L (ref 22–32)
Calcium: 9.6 mg/dL (ref 8.9–10.3)
Chloride: 103 mmol/L (ref 98–111)
Creatinine, Ser: 1.18 mg/dL (ref 0.61–1.24)
GFR, Estimated: 60 mL/min — ABNORMAL LOW (ref >60)
Glucose, Bld: 207 mg/dL — ABNORMAL HIGH (ref 70–99)
Potassium: 3.7 mmol/L (ref 3.5–5.1)
Sodium: 141 mmol/L (ref 135–145)

## 2022-03-31 LAB — D-DIMER, QUANTITATIVE: D-Dimer, Quant: 1.24 ug/mL-FEU — ABNORMAL HIGH (ref 0.00–0.50)

## 2022-03-31 LAB — CBC
HCT: 49.5 % (ref 39.0–52.0)
Hemoglobin: 15.6 g/dL (ref 13.0–17.0)
MCH: 27.1 pg (ref 26.0–34.0)
MCHC: 31.5 g/dL (ref 30.0–36.0)
MCV: 86.1 fL (ref 80.0–100.0)
Platelets: 143 10*3/uL — ABNORMAL LOW (ref 150–400)
RBC: 5.75 MIL/uL (ref 4.22–5.81)
RDW: 15.2 % (ref 11.5–15.5)
WBC: 5.7 10*3/uL (ref 4.0–10.5)
nRBC: 0 % (ref 0.0–0.2)

## 2022-03-31 LAB — HEPATIC FUNCTION PANEL
ALT: 12 U/L (ref 0–44)
AST: 15 U/L (ref 15–41)
Albumin: 3.4 g/dL — ABNORMAL LOW (ref 3.5–5.0)
Alkaline Phosphatase: 90 U/L (ref 38–126)
Bilirubin, Direct: 0.4 mg/dL — ABNORMAL HIGH (ref 0.0–0.2)
Indirect Bilirubin: 0.7 mg/dL (ref 0.3–0.9)
Total Bilirubin: 1.1 mg/dL (ref 0.3–1.2)
Total Protein: 6.7 g/dL (ref 6.5–8.1)

## 2022-03-31 LAB — TROPONIN I (HIGH SENSITIVITY): Troponin I (High Sensitivity): 39 ng/L — ABNORMAL HIGH (ref <18)

## 2022-03-31 LAB — BRAIN NATRIURETIC PEPTIDE: B Natriuretic Peptide: 630 pg/mL — ABNORMAL HIGH (ref 0.0–100.0)

## 2022-03-31 MED ORDER — FUROSEMIDE 10 MG/ML IJ SOLN
40.0000 mg | Freq: Two times a day (BID) | INTRAMUSCULAR | Status: DC
  Administered 2022-04-01 – 2022-04-02 (×4): 40 mg via INTRAVENOUS
  Filled 2022-03-31 (×4): qty 4

## 2022-03-31 MED ORDER — TAMSULOSIN HCL 0.4 MG PO CAPS
0.4000 mg | ORAL_CAPSULE | Freq: Every morning | ORAL | Status: DC
  Administered 2022-04-01 – 2022-04-02 (×2): 0.4 mg via ORAL
  Filled 2022-03-31 (×2): qty 1

## 2022-03-31 MED ORDER — IOHEXOL 350 MG/ML SOLN
100.0000 mL | Freq: Once | INTRAVENOUS | Status: AC | PRN
  Administered 2022-03-31: 100 mL via INTRAVENOUS

## 2022-03-31 MED ORDER — ONDANSETRON HCL 4 MG/2ML IJ SOLN: 4.0000 mg | Freq: Four times a day (QID) | INTRAMUSCULAR | Status: DC | PRN

## 2022-03-31 MED ORDER — HYDRALAZINE HCL 20 MG/ML IJ SOLN
10.0000 mg | Freq: Once | INTRAMUSCULAR | Status: AC
  Administered 2022-03-31: 10 mg via INTRAVENOUS
  Filled 2022-03-31: qty 1

## 2022-03-31 MED ORDER — ACETAMINOPHEN 650 MG RE SUPP: 650.0000 mg | Freq: Four times a day (QID) | RECTAL | Status: DC | PRN

## 2022-03-31 MED ORDER — MONTELUKAST SODIUM 10 MG PO TABS
10.0000 mg | ORAL_TABLET | Freq: Every day | ORAL | Status: DC
  Administered 2022-04-01 – 2022-04-02 (×2): 10 mg via ORAL
  Filled 2022-03-31 (×2): qty 1

## 2022-03-31 MED ORDER — MOMETASONE FURO-FORMOTEROL FUM 200-5 MCG/ACT IN AERO
2.0000 | INHALATION_SPRAY | Freq: Two times a day (BID) | RESPIRATORY_TRACT | Status: DC
  Administered 2022-04-01 – 2022-04-02 (×3): 2 via RESPIRATORY_TRACT
  Filled 2022-03-31: qty 8.8

## 2022-03-31 MED ORDER — AMIODARONE HCL 200 MG PO TABS
100.0000 mg | ORAL_TABLET | Freq: Every day | ORAL | Status: DC
  Administered 2022-04-01 – 2022-04-02 (×2): 100 mg via ORAL
  Filled 2022-03-31 (×2): qty 1

## 2022-03-31 MED ORDER — SODIUM CHLORIDE 0.9 % IV SOLN
2.0000 g | Freq: Once | INTRAVENOUS | Status: AC
  Administered 2022-03-31: 2 g via INTRAVENOUS
  Filled 2022-03-31: qty 20

## 2022-03-31 MED ORDER — INSULIN ASPART 100 UNIT/ML IJ SOLN
0.0000 [IU] | Freq: Every day | INTRAMUSCULAR | Status: DC
  Administered 2022-04-01: 3 [IU] via SUBCUTANEOUS

## 2022-03-31 MED ORDER — SODIUM CHLORIDE 0.9 % IV SOLN
500.0000 mg | Freq: Once | INTRAVENOUS | Status: AC
  Administered 2022-03-31: 500 mg via INTRAVENOUS
  Filled 2022-03-31: qty 5

## 2022-03-31 MED ORDER — HEPARIN SODIUM (PORCINE) 5000 UNIT/ML IJ SOLN
5000.0000 [IU] | Freq: Three times a day (TID) | INTRAMUSCULAR | Status: DC
  Administered 2022-04-01: 5000 [IU] via SUBCUTANEOUS
  Filled 2022-03-31: qty 1

## 2022-03-31 MED ORDER — ASPIRIN EC 81 MG PO TBEC
81.0000 mg | DELAYED_RELEASE_TABLET | Freq: Every day | ORAL | Status: DC
  Administered 2022-04-01 – 2022-04-02 (×2): 81 mg via ORAL
  Filled 2022-03-31 (×2): qty 1

## 2022-03-31 MED ORDER — FLUOXETINE HCL 20 MG PO CAPS
20.0000 mg | ORAL_CAPSULE | Freq: Every morning | ORAL | Status: DC
  Administered 2022-04-01 – 2022-04-02 (×2): 20 mg via ORAL
  Filled 2022-03-31 (×2): qty 1

## 2022-03-31 MED ORDER — MORPHINE SULFATE (PF) 2 MG/ML IV SOLN: 2.0000 mg | INTRAVENOUS | Status: DC | PRN

## 2022-03-31 MED ORDER — LOSARTAN POTASSIUM 50 MG PO TABS
25.0000 mg | ORAL_TABLET | Freq: Every day | ORAL | Status: DC
  Administered 2022-04-01 – 2022-04-02 (×2): 25 mg via ORAL
  Filled 2022-03-31 (×2): qty 1

## 2022-03-31 MED ORDER — SIMVASTATIN 20 MG PO TABS
10.0000 mg | ORAL_TABLET | Freq: Every day | ORAL | Status: DC
  Administered 2022-04-01 (×2): 10 mg via ORAL
  Filled 2022-03-31 (×2): qty 1

## 2022-03-31 MED ORDER — ACETAMINOPHEN 325 MG PO TABS: 650.0000 mg | ORAL_TABLET | Freq: Four times a day (QID) | ORAL | Status: DC | PRN

## 2022-03-31 MED ORDER — ALBUTEROL SULFATE HFA 108 (90 BASE) MCG/ACT IN AERS: 1.0000 | INHALATION_SPRAY | Freq: Four times a day (QID) | RESPIRATORY_TRACT | Status: DC | PRN

## 2022-03-31 MED ORDER — OXYCODONE HCL 5 MG PO TABS: 5.0000 mg | ORAL_TABLET | ORAL | Status: DC | PRN

## 2022-03-31 MED ORDER — ONDANSETRON HCL 4 MG PO TABS: 4.0000 mg | ORAL_TABLET | Freq: Four times a day (QID) | ORAL | Status: DC | PRN

## 2022-03-31 MED ORDER — PANTOPRAZOLE SODIUM 40 MG PO TBEC
40.0000 mg | DELAYED_RELEASE_TABLET | Freq: Every day | ORAL | Status: DC
  Administered 2022-04-01 – 2022-04-02 (×2): 40 mg via ORAL
  Filled 2022-03-31 (×2): qty 1

## 2022-03-31 MED ORDER — INSULIN ASPART 100 UNIT/ML IJ SOLN
0.0000 [IU] | Freq: Three times a day (TID) | INTRAMUSCULAR | Status: DC
  Administered 2022-04-01: 2 [IU] via SUBCUTANEOUS
  Administered 2022-04-01: 3 [IU] via SUBCUTANEOUS
  Administered 2022-04-02: 8 [IU] via SUBCUTANEOUS
  Administered 2022-04-02: 5 [IU] via SUBCUTANEOUS

## 2022-03-31 NOTE — ED Provider Notes (Signed)
?[facility] ?Provider Note ? ? ?CSN: [id number] ?Arrival date & time: 03/31/22  2106 ? ?  ? ?History ? ?Chief Complaint  ?Patient presents with  ? Chest Pain  ? ? ?Seth Hunt is a 88 y.o. male. ? ?Patient states he has been having chest discomfort and shortness of breath worsening over the last few weeks.  When he walks out to the mailbox and come back he starts having chest discomfort.  Patient has a history of hypertension ? ?The history is provided by the patient and medical records. No language interpreter was used.  ?Chest Pain ?Pain location:  Substernal area ?Pain quality: aching   ?Pain radiates to:  Does not radiate ?Pain severity:  Moderate ?Onset quality:  Sudden ?Timing:  Intermittent ?Chronicity:  Recurrent ?Associated symptoms: no abdominal pain, no back pain, no cough, no fatigue and no headache   ? ?  ? ?Home Medications ?Prior to Admission medications   ?Medication Sig Start Date End Date Taking? Authorizing Provider  ?albuterol (VENTOLIN HFA) 108 (90 Base) MCG/ACT inhaler Inhale 1-2 puffs into the lungs every 6 (six) hours as needed for wheezing or shortness of breath. 01/03/22   Barton Dubois, MD  ?amiodarone (PACERONE) 200 MG tablet Take 0.5 tablets (100 mg total) by mouth daily. 06/29/16   Patsey Berthold, NP  ?aspirin EC 81 MG tablet Take 81 mg by mouth daily.    [provider]  ?FLUoxetine (PROZAC) 20 MG capsule Take 20 mg by mouth every morning.    [provider]  ?fluticasone-salmeterol (ADVAIR HFA) 230-21 MCG/ACT inhaler Inhale 2 puffs into the lungs 2 (two) times daily. 01/03/22   Barton Dubois, MD  ?furosemide (LASIX) 40 MG tablet Take 1 tablet (40 mg total) by mouth daily. 05/04/17   Deboraha Sprang, MD  ?glimepiride (AMARYL) 4 MG tablet Take 4 mg by mouth daily.    [provider]  ?guaiFENesin (MUCINEX) 600 MG 12 hr tablet Take 1 tablet (600 mg total) by mouth 2 (two) times daily. 01/03/22   Barton Dubois, MD  ?losartan (COZAAR) 25 MG  tablet Take 1 tablet (25 mg total) by mouth daily. 09/02/15   Deboraha Sprang, MD  ?montelukast (SINGULAIR) 10 MG tablet Take 10 mg by mouth daily. 10/28/21   [provider]  ?pantoprazole (PROTONIX) 40 MG tablet Take 1 tablet (40 mg total) by mouth daily. 01/03/22 01/03/23  Barton Dubois, MD  ?predniSONE (DELTASONE) 20 MG tablet Take 3 tablets by mouth daily x1 day; then 2 tablet by mouth daily x2 days; then 1 tablet by mouth daily x3 days; then half tablet by mouth daily x3 days and stop prednisone. 01/03/22   Barton Dubois, MD  ?simvastatin (ZOCOR) 10 MG tablet Take 1 tablet (10 mg total) by mouth at bedtime. 01/02/14   Rai, Vernelle Emerald, MD  ?tamsulosin (FLOMAX) 0.4 MG CAPS capsule Take 0.4 mg by mouth every morning.     [provider]  ?   ? ?Allergies    ?Patient has no known allergies.   ? ?Review of Systems   ?Review of Systems  ?Constitutional:  Negative for appetite change and fatigue.  ?HENT:  Negative for congestion, ear discharge and sinus pressure.   ?Eyes:  Negative for discharge.  ?Respiratory:  Negative for cough.   ?Cardiovascular:  Positive for chest pain.  ?Gastrointestinal:  Negative for abdominal pain and diarrhea.  ?Genitourinary:  Negative for frequency and hematuria.  ?Musculoskeletal:  Negative for back pain.  ?Skin:  Negative for rash.  ?Neurological:  Negative for seizures and headaches.  ?Psychiatric/Behavioral:  Negative for hallucinations.   ? ?Physical Exam ?Updated Vital Signs ?BP (!) 187/90   Pulse (!) 47   Temp 97.7 ?F (36.5 ?C) (Oral)   Resp (!) 22   Ht 5\' 9"  (1.753 m)   Wt 67.7 kg   SpO2 96%   BMI 22.04 kg/m?  ?Physical Exam ?Vitals and nursing note reviewed.  ?Constitutional:   ?   Appearance: He is well-developed.  ?HENT:  ?   Head: Normocephalic.  ?   Nose: Nose normal.  ?Eyes:  ?   General: No scleral icterus. ?   Conjunctiva/sclera: Conjunctivae normal.  ?Neck:  ?   Thyroid: No thyromegaly.  ?Cardiovascular:  ?   Rate and Rhythm: Normal rate and regular  rhythm.  ?   Heart sounds: No murmur heard. ?  No friction rub. No gallop.  ?Pulmonary:  ?   Breath sounds: No stridor. No wheezing or rales.  ?Chest:  ?   Chest wall: No tenderness.  ?Abdominal:  ?   General: There is no distension.  ?   Tenderness: There is no abdominal tenderness. There is no rebound.  ?Musculoskeletal:     ?   General: Normal range of motion.  ?   Cervical back: Neck supple.  ?Lymphadenopathy:  ?   Cervical: No cervical adenopathy.  ?Skin: ?   Findings: No erythema or rash.  ?Neurological:  ?   Mental Status: He is alert and oriented to person, place, and time.  ?   Motor: No abnormal muscle tone.  ?   Coordination: Coordination normal.  ?Psychiatric:     ?   Behavior: Behavior normal.  ? ? ?ED Results / Procedures / Treatments   ?Labs ?(all labs ordered are listed, but only abnormal results are displayed) ?Labs Reviewed  ?BASIC METABOLIC PANEL - Abnormal; Notable for the following components:  ?    Result Value  ? Glucose, Bld 207 (*)   ? GFR, Estimated 60 (*)   ? All other components within normal limits  ?CBC - Abnormal; Notable for the following components:  ? Platelets 143 (*)   ? All other components within normal limits  ?BRAIN NATRIURETIC PEPTIDE - Abnormal; Notable for the following components:  ? B Natriuretic Peptide 630.0 (*)   ? All other components within normal limits  ?HEPATIC FUNCTION PANEL - Abnormal; Notable for the following components:  ? Albumin 3.4 (*)   ? Bilirubin, Direct 0.4 (*)   ? All other components within normal limits  ?D-DIMER, QUANTITATIVE - Abnormal; Notable for the following components:  ? D-Dimer, Quant 1.24 (*)   ? All other components within normal limits  ?TROPONIN I (HIGH SENSITIVITY) - Abnormal; Notable for the following components:  ? Troponin I (High Sensitivity) 39 (*)   ? All other components within normal limits  ?RESP PANEL BY RT-PCR (FLU A&B, COVID) [id number]  ?DIFFERENTIAL  ?URINALYSIS, ROUTINE W REFLEX MICROSCOPIC  ?TROPONIN I (HIGH SENSITIVITY)   ? ? ?EKG ?None ? ?Radiology ?DG Chest 2 View ? ?Result Date: 03/31/2022 ?CLINICAL DATA:  Chest pain. EXAM: CHEST - 2 VIEW COMPARISON:  Chest x-ray 01/01/2022. FINDINGS: There is a new small to moderate-sized right pleural effusion with right basilar patchy opacities. There are also new linear and patchy opacities in the right mid lung. There is no pneumothorax. Cardiomediastinal silhouette is within normal limits. IMPRESSION: 1. New small to moderate-sized right pleural effusion with right mid and  lower lung airspace disease. Electronically Signed   By: Ronney Asters M.D.   On: 03/31/2022 22:04   ? ?Procedures ?Procedures  ? ? ?Medications Ordered in ED ?Medications  ?cefTRIAXone (ROCEPHIN) 2 g in sodium chloride 0.9 % 100 mL IVPB (2 g Intravenous New Bag/Given 03/31/22 2252)  ?azithromycin (ZITHROMAX) 500 mg in sodium chloride 0.9 % 250 mL IVPB (has no administration in time range)  ?iohexol (OMNIPAQUE) 350 MG/ML injection 100 mL (100 mLs Intravenous Contrast Given 03/31/22 2241)  ? ? ?ED Course/ Medical Decision Making/ A&P ?Patient has pleural effusion and possible pneumonia on chest x-ray ?                        ? ?CRITICAL CARE ?Performed by: Milton Ferguson ?Total critical care time: 40 minutes ?Critical care time was exclusive of separately billable procedures and treating other patients. ?Critical care was necessary to treat or prevent imminent or life-threatening deterioration. ?Critical care was time spent personally by me on the following activities: development of treatment plan with patient and/or surrogate as well as nursing, discussions with consultants, evaluation of patient's response to treatment, examination of patient, obtaining history from patient or surrogate, ordering and performing treatments and interventions, ordering and review of laboratory studies, ordering and review of radiographic studies, pulse oximetry and re-evaluation of patient's condition. ? ?Medical Decision Making ?Amount and/or  Complexity of Data Reviewed ?Labs: ordered. ?Radiology: ordered. ? ?Risk ?Prescription drug management. ?Decision regarding hospitalization. ? ?This patient presents to the ED for concern of chest pain and s

## 2022-03-31 NOTE — ED Triage Notes (Signed)
Pt c/o chest pain and sob. Pt lives by himself and is altered per ems. Pt does not have any of his home meds.  ?

## 2022-04-01 DIAGNOSIS — Z7951 Long term (current) use of inhaled steroids: Secondary | ICD-10-CM | POA: Diagnosis not present

## 2022-04-01 DIAGNOSIS — I252 Old myocardial infarction: Secondary | ICD-10-CM | POA: Diagnosis not present

## 2022-04-01 DIAGNOSIS — I110 Hypertensive heart disease with heart failure: Secondary | ICD-10-CM | POA: Diagnosis present

## 2022-04-01 DIAGNOSIS — Z20822 Contact with and (suspected) exposure to covid-19: Secondary | ICD-10-CM | POA: Diagnosis present

## 2022-04-01 DIAGNOSIS — I169 Hypertensive crisis, unspecified: Secondary | ICD-10-CM

## 2022-04-01 DIAGNOSIS — R32 Unspecified urinary incontinence: Secondary | ICD-10-CM | POA: Diagnosis present

## 2022-04-01 DIAGNOSIS — Z9049 Acquired absence of other specified parts of digestive tract: Secondary | ICD-10-CM | POA: Diagnosis not present

## 2022-04-01 DIAGNOSIS — K219 Gastro-esophageal reflux disease without esophagitis: Secondary | ICD-10-CM

## 2022-04-01 DIAGNOSIS — I493 Ventricular premature depolarization: Secondary | ICD-10-CM

## 2022-04-01 DIAGNOSIS — I959 Hypotension, unspecified: Secondary | ICD-10-CM | POA: Diagnosis present

## 2022-04-01 DIAGNOSIS — R001 Bradycardia, unspecified: Secondary | ICD-10-CM | POA: Diagnosis present

## 2022-04-01 DIAGNOSIS — J432 Centrilobular emphysema: Secondary | ICD-10-CM | POA: Diagnosis present

## 2022-04-01 DIAGNOSIS — J449 Chronic obstructive pulmonary disease, unspecified: Secondary | ICD-10-CM | POA: Diagnosis not present

## 2022-04-01 DIAGNOSIS — Z833 Family history of diabetes mellitus: Secondary | ICD-10-CM | POA: Diagnosis not present

## 2022-04-01 DIAGNOSIS — Z79899 Other long term (current) drug therapy: Secondary | ICD-10-CM | POA: Diagnosis not present

## 2022-04-01 DIAGNOSIS — R778 Other specified abnormalities of plasma proteins: Secondary | ICD-10-CM | POA: Diagnosis present

## 2022-04-01 DIAGNOSIS — I2510 Atherosclerotic heart disease of native coronary artery without angina pectoris: Secondary | ICD-10-CM | POA: Diagnosis present

## 2022-04-01 DIAGNOSIS — I4891 Unspecified atrial fibrillation: Secondary | ICD-10-CM

## 2022-04-01 DIAGNOSIS — N3001 Acute cystitis with hematuria: Secondary | ICD-10-CM | POA: Diagnosis not present

## 2022-04-01 DIAGNOSIS — Z91141 Patient's other noncompliance with medication regimen due to financial hardship: Secondary | ICD-10-CM | POA: Diagnosis not present

## 2022-04-01 DIAGNOSIS — R079 Chest pain, unspecified: Secondary | ICD-10-CM | POA: Diagnosis present

## 2022-04-01 DIAGNOSIS — E119 Type 2 diabetes mellitus without complications: Secondary | ICD-10-CM | POA: Diagnosis not present

## 2022-04-01 DIAGNOSIS — E1165 Type 2 diabetes mellitus with hyperglycemia: Secondary | ICD-10-CM | POA: Diagnosis present

## 2022-04-01 DIAGNOSIS — I429 Cardiomyopathy, unspecified: Secondary | ICD-10-CM | POA: Diagnosis present

## 2022-04-01 DIAGNOSIS — E1122 Type 2 diabetes mellitus with diabetic chronic kidney disease: Secondary | ICD-10-CM | POA: Diagnosis not present

## 2022-04-01 DIAGNOSIS — Z28310 Unvaccinated for covid-19: Secondary | ICD-10-CM | POA: Diagnosis not present

## 2022-04-01 DIAGNOSIS — Z7982 Long term (current) use of aspirin: Secondary | ICD-10-CM | POA: Diagnosis not present

## 2022-04-01 DIAGNOSIS — I509 Heart failure, unspecified: Secondary | ICD-10-CM | POA: Diagnosis not present

## 2022-04-01 DIAGNOSIS — N401 Enlarged prostate with lower urinary tract symptoms: Secondary | ICD-10-CM | POA: Diagnosis present

## 2022-04-01 DIAGNOSIS — I5023 Acute on chronic systolic (congestive) heart failure: Secondary | ICD-10-CM | POA: Diagnosis present

## 2022-04-01 DIAGNOSIS — R351 Nocturia: Secondary | ICD-10-CM | POA: Diagnosis present

## 2022-04-01 DIAGNOSIS — N1832 Chronic kidney disease, stage 3b: Secondary | ICD-10-CM

## 2022-04-01 DIAGNOSIS — I5022 Chronic systolic (congestive) heart failure: Secondary | ICD-10-CM

## 2022-04-01 DIAGNOSIS — J9601 Acute respiratory failure with hypoxia: Secondary | ICD-10-CM | POA: Diagnosis present

## 2022-04-01 DIAGNOSIS — Z7901 Long term (current) use of anticoagulants: Secondary | ICD-10-CM | POA: Diagnosis not present

## 2022-04-01 LAB — CBC
HCT: 47.4 % (ref 39.0–52.0)
Hemoglobin: 14.3 g/dL (ref 13.0–17.0)
MCH: 26.2 pg (ref 26.0–34.0)
MCHC: 30.2 g/dL (ref 30.0–36.0)
MCV: 86.8 fL (ref 80.0–100.0)
Platelets: 118 10*3/uL — ABNORMAL LOW (ref 150–400)
RBC: 5.46 MIL/uL (ref 4.22–5.81)
RDW: 15.4 % (ref 11.5–15.5)
WBC: 6 10*3/uL (ref 4.0–10.5)
nRBC: 0 % (ref 0.0–0.2)

## 2022-04-01 LAB — GLUCOSE, CAPILLARY
Glucose-Capillary: 159 mg/dL — ABNORMAL HIGH (ref 70–99)
Glucose-Capillary: 150 mg/dL — ABNORMAL HIGH (ref 70–99)
Glucose-Capillary: 68 mg/dL — ABNORMAL LOW (ref 70–99)
Glucose-Capillary: 257 mg/dL — ABNORMAL HIGH (ref 70–99)
Glucose-Capillary: 215 mg/dL — ABNORMAL HIGH (ref 70–99)
Glucose-Capillary: 252 mg/dL — ABNORMAL HIGH (ref 70–99)

## 2022-04-01 LAB — COMPREHENSIVE METABOLIC PANEL
ALT: 11 U/L (ref 0–44)
AST: 15 U/L (ref 15–41)
Albumin: 3 g/dL — ABNORMAL LOW (ref 3.5–5.0)
Alkaline Phosphatase: 80 U/L (ref 38–126)
Anion gap: 11 (ref 5–15)
BUN: 18 mg/dL (ref 8–23)
CO2: 29 mmol/L (ref 22–32)
Calcium: 9.4 mg/dL (ref 8.9–10.3)
Chloride: 102 mmol/L (ref 98–111)
Creatinine, Ser: 1.11 mg/dL (ref 0.61–1.24)
GFR, Estimated: >60 mL/min (ref >60)
Glucose, Bld: 200 mg/dL — ABNORMAL HIGH (ref 70–99)
Potassium: 3.9 mmol/L (ref 3.5–5.1)
Sodium: 142 mmol/L (ref 135–145)
Total Bilirubin: 0.9 mg/dL (ref 0.3–1.2)
Total Protein: 6.1 g/dL — ABNORMAL LOW (ref 6.5–8.1)

## 2022-04-01 LAB — URINALYSIS, ROUTINE W REFLEX MICROSCOPIC
Bilirubin Urine: NEGATIVE
Glucose, UA: 50 mg/dL — AB
Ketones, ur: 5 mg/dL — AB
Leukocytes,Ua: NEGATIVE
Nitrite: NEGATIVE
Protein, ur: 30 mg/dL — AB
Specific Gravity, Urine: 1.012 (ref 1.005–1.030)
pH: 5 (ref 5.0–8.0)

## 2022-04-01 LAB — TROPONIN I (HIGH SENSITIVITY): Troponin I (High Sensitivity): 36 ng/L — ABNORMAL HIGH (ref <18)

## 2022-04-01 LAB — TSH: TSH: 2.245 u[IU]/mL (ref 0.350–4.500)

## 2022-04-01 LAB — MAGNESIUM: Magnesium: 2.1 mg/dL (ref 1.7–2.4)

## 2022-04-01 LAB — RESP PANEL BY RT-PCR (FLU A&B, COVID) ARPGX2
Influenza A by PCR: NEGATIVE
Influenza B by PCR: NEGATIVE
SARS Coronavirus 2 by RT PCR: NEGATIVE

## 2022-04-01 MED ORDER — APIXABAN 5 MG PO TABS
5.0000 mg | ORAL_TABLET | Freq: Two times a day (BID) | ORAL | Status: DC
  Administered 2022-04-01 – 2022-04-02 (×3): 5 mg via ORAL
  Filled 2022-04-01 (×3): qty 1

## 2022-04-01 NOTE — Assessment & Plan Note (Addendum)
-   History of chronic systolic heart failure ?-Last echo in January 2023 with an ejection fraction of 40 to 45% with global hypokinesis and indeterminate diastolic parameters ?-Peripheral edema on exam ?-Chest imaging shows bilateral pleural effusions right greater than left ?-Starting Lasix 40 mg IV twice daily ?-Continue ARB, continue statin, continue aspirin ?-Updated echo is ordered secondary to what seems to be new onset A-fib, and can update EF as well ?-BNP 630, troponin initially 39 ?-EKG without ischemic changes ?-Monitor intake and output ?-Continue to monitor ?

## 2022-04-01 NOTE — Assessment & Plan Note (Signed)
Continue Protonix °

## 2022-04-01 NOTE — Assessment & Plan Note (Signed)
-   Chest pain is likely related to CHF ?-CTA shows no PE ?-EKG without ischemic changes ?-Troponin downtrending from 39-36 ?-Continue to monitor ?

## 2022-04-01 NOTE — Progress Notes (Signed)
?PROGRESS NOTE ? ? ? ?Seth Hunt  [id number] DOB: [birth year] DOA: 03/31/2022 ?PCP: Evelene Croon, MD ? ? ?Brief Narrative:  ?Seth Hunt is a 88 y.o. male with medical history significant of with history of BPH, coronary artery disease, cardiomyopathy, COPD, diabetes mellitus without complication, presents to the ED with a chief complaint of dyspnea.  Initially only dyspneic with exertion now profoundly short of breath even at rest.  Unable to perform ADLs or ambulate more than a few feet before having symptoms.  Patient admitted for atypical chest pain, dyspnea with exertion concerning for heart failure exacerbation. ? ?Assessment & Plan: ?  ?Principal Problem: ?  Chest pain ?Active Problems: ?  COPD (chronic obstructive pulmonary disease) ([place]) ?  DM (diabetes mellitus) ([place]) ?  Hypertensive crisis ?  Troponin level elevated ?  Frequent PVCs ?  Chronic systolic HF (heart failure) ([place]) ?  Gastroesophageal reflux disease ? ? ?Acute hypoxic respiratory failure, likely multifactorial ?Acute on chronic systolic heart failure exacerbation ?-Elevated BNP, bilateral pleural effusions and hypoxia as well as dyspnea on exertion consistent with heart failure exacerbation ?-Continue to wean oxygen as tolerated, increased ambulatory activity and exertion as tolerated -PT following ?-Continue diuresis, follow renal function as below ?-Echo shows ongoing depressed systolic ejection fraction 40 to 45% ? ?Atypical chest pain secondary to above ?-CTA shows no PE ?-EKG without ischemic changes ?-Troponin flat in the 30s, not consistent with ACS ?-Questionably GI related, see below ? ?Gastroesophageal reflux disease ?- Continue Protonix -unclear of patient's atypical substernal or epigastric pain is GERD related, continue supportive care and follow clinically ?  ?Chronic systolic HF (heart failure) ([place]) ?- History of chronic systolic heart failure ?-Last echo in January 2023 with an ejection fraction of 40 to 45% with  global hypokinesis and indeterminate diastolic parameters ?-Chest imaging shows bilateral pleural effusions right greater than left ?-Continue Lasix, follow I's and O's ?-Continue ARB, continue statin, continue aspirin -holding beta-blocker/Entresto given bradycardia and borderline hypotension on current regimen ?-Updated echo is ordered secondary to what seems to be new onset A-fib, and can update EF as well ?-BNP 630, troponin initially 39 ?-EKG without ischemic changes ?-Monitor intake and output ?-Continue to monitor ?  ?New onset/newly diagnosed A-fib ?-Questionably provoked secondary to above ?-Currently rate controlled on amiodarone appears to be in sinus with frequent PVCs on telemetry ?-Initiated on Eliquis at intake ?-TSH within normal limits ?  ?Troponin level elevated, likely supply/demand mismatch given above ?-Initially 39, downtrending to 36 ?-No ischemic changes on EKG ?- likely due to hypertensive crisis ?-BP has responded well to hydralazine and now systolic 0000000 ?-Monitor on telemetry ?-No complaints of chest pain ?  ?Hypertensive crisis, resolved ?- Blood pressure as high as 201/108 ?-Secondary to nonadherence to medical regimen due to financial barriers regarding access to medication ?-Continue patient's home ARB ?  ?DM (diabetes mellitus) ([place]), uncontrolled ?- Holding home glimepiride ?-Sliding scale coverage ?-Monitor CBGs ?Lab Results  ?Component Value Date  ? HGBA1C 7.8 (H) 01/02/2022  ?  ?COPD (chronic obstructive pulmonary disease) ([place]) ?- Continue Singulair, Advair ?-Patient was tachypneic at arrival and is currently on 2 L nasal cannula ?-Patient reports no oxygen at baseline at home ?-Clinical presentation is not consistent with COPD exacerbation ?-Continue to monitor ? ?DVT prophylaxis: Eliquis ?Code Status: Full ?Family Communication: None present ? ?Status is: Inpatient ? ?Dispo: The patient is from: Home ?             Anticipated d/c is to: Home ?  Anticipated d/c date  is: 48 to 72 hours ?             Patient currently not medically stable for discharge ? ?Consultants:  ?None ? ?Procedures:  ?None ? ?Antimicrobials:  ?None ? ?Subjective: ?No acute issues or events overnight, generally improving, respiratory status appears to be approaching baseline but still profoundly dyspneic with minimal exertion. ? ?Objective: ?Vitals:  ? 03/31/22 2330 04/01/22 0000 04/01/22 0050 04/01/22 0432  ?BP: (!) 172/130 (!) 149/126 (!) 148/66 (!) 137/59  ?Pulse: 73 (!) 106 65 60  ?Resp: (!) 26 (!) 24 19 19   ?Temp:   98 ?F (36.7 ?C) 98 ?F (36.7 ?C)  ?TempSrc:      ?SpO2: 99% 93% 100% 100%  ?Weight: 62 kg     ?Height:      ? ? ?Intake/Output Summary (Last 24 hours) at 04/01/2022 0725 ?Last data filed at 04/01/2022 [id number] ?Gross per 24 hour  ?Intake 350 ml  ?Output 1100 ml  ?Net -750 ml  ? ?Filed Weights  ? 03/31/22 2110 03/31/22 2330  ?Weight: 67.7 kg 62 kg  ? ? ?Examination: ? ?General:  Pleasantly resting in bed, No acute distress. ?HEENT:  Normocephalic atraumatic.  Sclerae nonicteric, noninjected.  Extraocular movements intact bilaterally. ?Neck:  Without mass or deformity.  Trachea is midline. ?Lungs: Bibasilar rales right greater than left without rhonchi or wheeze. ?Heart:  Regular rate and rhythm.  Without murmurs, rubs, or gallops. ?Abdomen:  Soft, nontender, nondistended.  Without guarding or rebound. ?Extremities: Without cyanosis, clubbing, scant 1+ pitting edema bilateral lower extremities. ?Vascular:  Dorsalis pedis and posterior tibial pulses palpable bilaterally. ?Skin:  Warm and dry, no erythema, no ulcerations. ? ? ?Data Reviewed: I have personally reviewed following labs and imaging studies ? ?CBC: ?Recent Labs  ?Lab 03/31/22 ?2115 04/01/22 ?0454  ?WBC 5.7 6.0  ?NEUTROABS 3.6  --   ?HGB 15.6 14.3  ?HCT 49.5 47.4  ?MCV 86.1 86.8  ?PLT 143* 118*  ? ?Basic Metabolic Panel: ?Recent Labs  ?Lab 03/31/22 ?2115 04/01/22 ?0454  ?NA 141 142  ?K 3.7 3.9  ?CL 103 102  ?CO2 31 29  ?GLUCOSE 207* 200*   ?BUN 19 18  ?CREATININE 1.18 1.11  ?CALCIUM 9.6 9.4  ?MG  --  2.1  ? ?GFR: ?Estimated Creatinine Clearance: 41.1 mL/min (by C-G formula based on SCr of 1.11 mg/dL). ?Liver Function Tests: ?Recent Labs  ?Lab 03/31/22 ?2115 04/01/22 ?0454  ?AST 15 15  ?ALT 12 11  ?ALKPHOS 90 80  ?BILITOT 1.1 0.9  ?PROT 6.7 6.1*  ?ALBUMIN 3.4* 3.0*  ? ?No results for input(s): LIPASE, AMYLASE in the last 168 hours. ?No results for input(s): AMMONIA in the last 168 hours. ?Coagulation Profile: ?No results for input(s): INR, PROTIME in the last 168 hours. ?Cardiac Enzymes: ?No results for input(s): CKTOTAL, CKMB, CKMBINDEX, TROPONINI in the last 168 hours. ?BNP (last 3 results) ?No results for input(s): PROBNP in the last 8760 hours. ?HbA1C: ?No results for input(s): HGBA1C in the last 72 hours. ?CBG: ?Recent Labs  ?Lab 04/01/22 ?[id number]  ?GLUCAP 159*  ? ?Lipid Profile: ?No results for input(s): CHOL, HDL, LDLCALC, TRIG, CHOLHDL, LDLDIRECT in the last 72 hours. ?Thyroid Function Tests: ?Recent Labs  ?  03/31/22 ?2115  ?TSH 2.245  ? ?Anemia Panel: ?No results for input(s): VITAMINB12, FOLATE, FERRITIN, TIBC, IRON, RETICCTPCT in the last 72 hours. ?Sepsis Labs: ?No results for input(s): PROCALCITON, LATICACIDVEN in the last 168 hours. ? ?Recent Results (from the past 240 hour(s))  ?  Resp Panel by RT-PCR (Flu A&B, Covid) Nasopharyngeal Swab     Status: None  ? Collection Time: 03/31/22 10:53 PM  ? Specimen: Nasopharyngeal Swab; Nasopharyngeal(NP) swabs in vial transport medium  ?Result Value Ref Range Status  ? SARS Coronavirus 2 by RT PCR NEGATIVE NEGATIVE Final  ?  Comment: (NOTE) ?SARS-CoV-2 target nucleic acids are NOT DETECTED. ? ?The SARS-CoV-2 RNA is generally detectable in upper respiratory ?specimens during the acute phase of infection. The lowest ?concentration of SARS-CoV-2 viral copies this assay can detect is ?138 copies/mL. A negative result does not preclude SARS-Cov-2 ?infection and should not be used as the sole basis for  treatment or ?other patient management decisions. A negative result may occur with  ?improper specimen collection/handling, submission of specimen other ?than nasopharyngeal swab, presence of viral mutation(

## 2022-04-01 NOTE — TOC Initial Note (Addendum)
Transition of Care (TOC) - Initial/Assessment Note  ? ? ?Patient Details  ?Name: Seth Hunt ?MRN: [id number] ?Date of Birth: Apr 19, 1933 ? ?Transition of Care (TOC) CM/SW Contact:    ?Rielly Corlett D, LCSW ?Phone Number: ?04/01/2022, 2:43 PM ? ?Clinical Narrative:                 ?Patient from home with two adult granddaughters. Independent with ADLs. Family transports to appointments. Independent with ambulation. Has nebulizer. Does not follow heart healthy diet, does not take daily weights. Review of chart indicates that patient had RW and HH with [facility] ordered in Jan 2020.  ? ?Expected Discharge Plan: Home/Self Care ?Barriers to Discharge: Continued Medical Work up ? ? ?Patient Goals and CMS Choice ?Patient states their goals for this hospitalization and ongoing recovery are:: return home ?  ?  ? ?Expected Discharge Plan and Services ?Expected Discharge Plan: Home/Self Care ?  ?  ?  ?Living arrangements for the past 2 months: [place] ?                ?  ?  ?  ?  ?  ?  ?  ?  ?  ?  ? ?Prior Living Arrangements/Services ?Living arrangements for the past 2 months: [place] ?Lives with:: Relatives ?Patient language and need for interpreter reviewed:: Yes ?Do you feel safe going back to the place where you live?: Yes      ?Need for Family Participation in Patient Care: Yes (Comment) ?Care giver support system in place?: Yes (comment) ?Current home services: DME (nebulizer) ?Criminal Activity/Legal Involvement Pertinent to Current Situation/Hospitalization: No - Comment as needed ? ?Activities of Daily Living ?  ?  ? ?Permission Sought/Granted ?Permission sought to share information with : Family Supports ?  ? Share Information with NAME: step-daughter, Leonides Grills ?   ?   ?   ? ?Emotional Assessment ?  ?  ?  ?Orientation: : Oriented to Self ?Alcohol / Substance Use: Not Applicable ?Psych Involvement: No (comment) ? ?Admission diagnosis:  Chest pain [R07.9] ?Nonspecific chest pain  [R07.9] ?Patient Active Problem List  ? Diagnosis Date Noted  ? Chest pain 03/31/2022  ? Community acquired pneumonia of right lower lobe of lung   ? Chronic systolic HF (heart failure) ([place])   ? Stage 3b chronic kidney disease (CKD) ([place])   ? Gastroesophageal reflux disease   ? Physical deconditioning   ? COPD exacerbation ([place]) 01/01/2022  ? Diarrhea 01/01/2014  ? Acute respiratory failure with hypoxia ([place]) 12/25/2013  ? NICM (nonischemic cardiomyopathy) ([place]) 12/25/2013  ? Frequent PVCs 12/25/2013  ? Troponin level elevated 12/24/2013  ? Influenza A 12/24/2013  ? Depression 12/24/2013  ? Thrombocytopenia, unspecified ([place]) 12/24/2013  ? COPD (chronic obstructive pulmonary disease) ([place]) 12/23/2013  ? DM (diabetes mellitus) ([place]) 12/23/2013  ? Hypertensive crisis 12/23/2013  ? SOB (shortness of breath) 12/23/2013  ? ?PCP:  Evelene Croon, MD ?Pharmacy:   ?[facility] [place], [place] AT [place] ?[id number] ?[place] [address] ?Phone: [phone] Fax: [phone] ? ? ? ? ?Social Determinants of Health (SDOH) Interventions ?  ? ?Readmission Risk Interventions ?   ? View : No data to display.  ?  ?  ?  ? ? ? ?

## 2022-04-01 NOTE — Assessment & Plan Note (Signed)
-   Continue home amiodarone ?-PVCs are seen on EKG today ?-EKG from today because atrial fibrillation and P waves are very hard to discern if they are there due to baseline artifact ?-Started on Eliquis ?-TSH, echo for work-up ?-Likely atrial fibrillation secondary to lung disease and CHF ?

## 2022-04-01 NOTE — Assessment & Plan Note (Signed)
-   Continue Singulair, Advair ?-Patient was tachypneic at arrival and is currently on 2 L nasal cannula ?-Patient reports no oxygen at baseline ?-Clinical presentation is not consistent with COPD exacerbation ?-Continue to monitor ?

## 2022-04-01 NOTE — Assessment & Plan Note (Addendum)
-  Initially 39, downtrending to 36 ?-No ischemic changes on EKG ?- likely due to hypertensive crisis ?-BP has responded well to hydralazine and now systolic 137 ?-Monitor on telemetry ?-No complaints of chest pain ?

## 2022-04-01 NOTE — Assessment & Plan Note (Signed)
-   Holding home glimepiride ?-Sliding scale coverage ?-Monitor CBGs ?

## 2022-04-01 NOTE — Progress Notes (Signed)
Bladder scan, post void, shows . Insert foley.  ?

## 2022-04-01 NOTE — Assessment & Plan Note (Signed)
-   Blood pressure as high as 201/108 ?-Secondary to nonadherence to medical regimen due to financial barriers regarding access to medication ?-Consult TOC for medication assistance ?-Continue patient's home ARB ?-1 dose of 10 mg hydralazine corrected blood pressure at admission ?-Monitor on telemetry ?

## 2022-04-01 NOTE — H&P (Signed)
?History and Physical  ? ? ?Patient: Seth Hunt [id number] DOB: 1933-06-20 ?DOA: 03/31/2022 ?DOS: the patient was seen and examined on 04/01/2022 ?PCP: Evelene Croon, MD  ?Patient coming from: Home ? ?Chief Complaint:  ?Chief Complaint  ?Patient presents with  ? Chest Pain  ? ?HPI: Seth Hunt is a 89 y.o. male with medical history significant of with history of BPH, coronary artery disease, cardiomyopathy, COPD, diabetes mellitus without complication, presents to the ED with a chief complaint of dyspnea.  Patient reports that he has had dyspnea since he had pneumonia in February.  It got worse over the past couple days.  He had a cough productive of sputum but he is not sure what color.  He has associated chest pain 1 or 2 times in the past couple days.  He had an MI several years ago and he reports that he has had chest pain since then.  He describes it as a sharp pain in the middle of his chest that last for seconds.  He does not have oxygen at home nor does he have inhalers nor breathing treatment.  He reports that he was not able to try any of these things to see if they would help his symptoms.  He reports subjective fever and chills, but has not measured a temperature.  He reports that he thinks he has had a fever because he has night sweats which has been going on for a long time.  Patient also reports he has had unintentional weight loss, but cannot remember how long its been going on.  Patient denies nausea, but admits to orthopnea and decreased appetite.  Patient also has a complaint of nocturia, which is not new, but seems to be getting worse.  He is supposed to be on Flomax due to BPH, but has financial barriers to medication, so has not had any of his home meds.  Other review systems negative. ? ?Patient does not smoke, does not drink, does not use illicit drugs.  He is not vaccinated for COVID.  Patient is full code. ?Review of Systems: As mentioned in the history of present illness. All  other systems reviewed and are negative. ?Past Medical History:  ?Diagnosis Date  ? BPH (benign prostatic hyperplasia)   ? CAD (coronary artery disease)   ? a. 04/2013 - abnl stress echo with possible fall in EF. f/u cath - 50% mid LAD lesion, an anomalous patent circumflex and mild RCA lesions. EF 40%.  ? Cardiomyopathy ([place])   ? a. 04/2013: EF 40% by cath.  ? COPD (chronic obstructive pulmonary disease) ([place])   ? Diabetes mellitus without complication ([place])   ? ?Past Surgical History:  ?Procedure Laterality Date  ? BACK SURGERY    ? CHOLECYSTECTOMY    ? HERNIA REPAIR    ? bilateral inguinal hernia  ? ?Social History:  reports that he has quit smoking. His smoking use included cigarettes. He has a 20.00 pack-year smoking history. He has never used smokeless tobacco. He reports that he does not drink alcohol and does not use drugs. ? ?No Known Allergies ? ?Family History  ?Problem Relation Age of Onset  ? CAD Father 36  ? CAD Mother 33  ?     Lived until age 59  ? Cancer Sister   ? Heart attack Brother   ? Stroke Brother   ? Diabetes Brother   ? ? ?Prior to Admission medications   ?Medication Sig Start Date End Date Taking? Authorizing Provider  ?  albuterol (VENTOLIN HFA) 108 (90 Base) MCG/ACT inhaler Inhale 1-2 puffs into the lungs every 6 (six) hours as needed for wheezing or shortness of breath. 01/03/22   Barton Dubois, MD  ?amiodarone (PACERONE) 200 MG tablet Take 0.5 tablets (100 mg total) by mouth daily. 06/29/16   Patsey Berthold, NP  ?aspirin EC 81 MG tablet Take 81 mg by mouth daily.    [provider]  ?FLUoxetine (PROZAC) 20 MG capsule Take 20 mg by mouth every morning.    [provider]  ?fluticasone-salmeterol (ADVAIR HFA) 230-21 MCG/ACT inhaler Inhale 2 puffs into the lungs 2 (two) times daily. 01/03/22   Barton Dubois, MD  ?furosemide (LASIX) 40 MG tablet Take 1 tablet (40 mg total) by mouth daily. 05/04/17   Deboraha Sprang, MD  ?glimepiride (AMARYL) 4 MG tablet Take 4 mg by mouth  daily.    [provider]  ?guaiFENesin (MUCINEX) 600 MG 12 hr tablet Take 1 tablet (600 mg total) by mouth 2 (two) times daily. 01/03/22   Barton Dubois, MD  ?losartan (COZAAR) 25 MG tablet Take 1 tablet (25 mg total) by mouth daily. 09/02/15   Deboraha Sprang, MD  ?montelukast (SINGULAIR) 10 MG tablet Take 10 mg by mouth daily. 10/28/21   [provider]  ?pantoprazole (PROTONIX) 40 MG tablet Take 1 tablet (40 mg total) by mouth daily. 01/03/22 01/03/23  Barton Dubois, MD  ?predniSONE (DELTASONE) 20 MG tablet Take 3 tablets by mouth daily x1 day; then 2 tablet by mouth daily x2 days; then 1 tablet by mouth daily x3 days; then half tablet by mouth daily x3 days and stop prednisone. 01/03/22   Barton Dubois, MD  ?simvastatin (ZOCOR) 10 MG tablet Take 1 tablet (10 mg total) by mouth at bedtime. 01/02/14   Rai, Vernelle Emerald, MD  ?tamsulosin (FLOMAX) 0.4 MG CAPS capsule Take 0.4 mg by mouth every morning.     [provider]  ? ? ?Physical Exam: ?Vitals:  ? 03/31/22 2330 04/01/22 0000 04/01/22 0050 04/01/22 0432  ?BP: (!) 172/130 (!) 149/126 (!) 148/66 (!) 137/59  ?Pulse: 73 (!) 106 65 60  ?Resp: (!) 26 (!) 24 19 19   ?Temp:   98 ?F (36.7 ?C) 98 ?F (36.7 ?C)  ?TempSrc:      ?SpO2: 99% 93% 100% 100%  ?Weight: 62 kg     ?Height:      ? ?1.  General: ?Patient lying supine in bed, ill-appearing ? ?2. Psychiatric: ?Alert and oriented x 3, mood and behavior normal for situation, pleasant and cooperative with exam ?  ?3. Neurologic: ?Speech and language are normal, face is symmetric, moves all 4 extremities voluntarily, at baseline without acute deficits on limited exam ?  ?4. HEENMT:  ?Head is atraumatic, normocephalic, pupils reactive to light, neck is cachectic, trachea is midline, mucous membranes are moist ?  ?5. Respiratory : ?Lungs are clear to auscultation bilaterally without wheezing, rhonchi, rales, no cyanosis, no increase in work of breathing or accessory muscle use ?  ?6. Cardiovascular  : ?Heart rate normal, rhythm is irregular, no murmurs, rubs or gallops, bilateral peripheral edema, peripheral pulses palpated ?  ?7. Gastrointestinal:  ?Abdomen is soft, nondistended, nontender to palpation bowel sounds active, no masses or organomegaly palpated ?  ?8. Skin:  ?Skin is warm, dry and intact without rashes, acute lesions, or ulcers on limited exam ?  ?9.Musculoskeletal:  ?No acute deformities or trauma, no asymmetry in tone,  peripheral pulses palpated, no tenderness to palpation  in the extremities ? ?Data Reviewed: ?In the ED ?Temp 97.7, heart rate 47-74, respiratory rate 19-22, blood pressure 184/90-201/108 ?No antihypertensives given ?No leukocytosis, hemoglobin 15.6, platelets 143 ?Chemistry panel is mostly unremarkable except for hyperglycemia 207 and an albumin 3.4 ?CTA shows no pulmonary embolism but does show central lobar emphysema and bilateral pleural effusions right greater than left ?EKG shows PVCs, A-fib, heart rate 60, QTc 504 ?Patient was given Rocephin and Zithromax to cover for possible pneumonia ?There is no pneumonia seen on CTA, no leukocytosis.  Cough is likely secondary to COPD.  In the subjective fever he has has been going on for a very long time, so I think this is more likely shortness of breath from CHF than from pneumonia.  Antibiotics are not continued on admission ? ?Assessment and Plan: ?* Chest pain ?- Chest pain is likely related to CHF ?-CTA shows no PE ?-EKG without ischemic changes ?-Troponin downtrending from 39-36 ?-Continue to monitor ? ?Gastroesophageal reflux disease ?- Continue Protonix ? ?Chronic systolic HF (heart failure) ([place]) ?- History of chronic systolic heart failure ?-Last echo in January 2023 with an ejection fraction of 40 to 45% with global hypokinesis and indeterminate diastolic parameters ?-Peripheral edema on exam ?-Chest imaging shows bilateral pleural effusions right greater than left ?-Starting Lasix 40 mg IV twice daily ?-Continue ARB,  continue statin, continue aspirin ?-Updated echo is ordered secondary to what seems to be new onset A-fib, and can update EF as well ?-BNP 630, troponin initially 39 ?-EKG without ischemic changes ?-Monitor intake

## 2022-04-02 ENCOUNTER — Emergency Department (HOSPITAL_COMMUNITY)
Admission: EM | Admit: 2022-04-02 | Discharge: 2022-04-03 | Disposition: A | Discharge status: Home / Self Care | Payer: Medicare Other | Attending: Emergency Medicine | Admitting: Emergency Medicine

## 2022-04-02 ENCOUNTER — Other Ambulatory Visit (HOSPITAL_COMMUNITY): Payer: Self-pay

## 2022-04-02 ENCOUNTER — Other Ambulatory Visit: Payer: Self-pay

## 2022-04-02 ENCOUNTER — Encounter (HOSPITAL_COMMUNITY): Payer: Self-pay

## 2022-04-02 DIAGNOSIS — N3001 Acute cystitis with hematuria: Secondary | ICD-10-CM | POA: Diagnosis not present

## 2022-04-02 DIAGNOSIS — Z7901 Long term (current) use of anticoagulants: Secondary | ICD-10-CM | POA: Insufficient documentation

## 2022-04-02 DIAGNOSIS — E119 Type 2 diabetes mellitus without complications: Secondary | ICD-10-CM | POA: Insufficient documentation

## 2022-04-02 DIAGNOSIS — J449 Chronic obstructive pulmonary disease, unspecified: Secondary | ICD-10-CM | POA: Insufficient documentation

## 2022-04-02 DIAGNOSIS — I509 Heart failure, unspecified: Secondary | ICD-10-CM | POA: Insufficient documentation

## 2022-04-02 DIAGNOSIS — I2510 Atherosclerotic heart disease of native coronary artery without angina pectoris: Secondary | ICD-10-CM | POA: Insufficient documentation

## 2022-04-02 LAB — URINALYSIS, ROUTINE W REFLEX MICROSCOPIC
Bilirubin Urine: NEGATIVE
Glucose, UA: NEGATIVE mg/dL
Ketones, ur: NEGATIVE mg/dL
Nitrite: NEGATIVE
Protein, ur: 30 mg/dL — AB
RBC / HPF: >50 RBC/hpf — ABNORMAL HIGH (ref 0–5)
Specific Gravity, Urine: 1.012 (ref 1.005–1.030)
pH: 5 (ref 5.0–8.0)

## 2022-04-02 LAB — BASIC METABOLIC PANEL
Anion gap: 8 (ref 5–15)
Anion gap: 5 (ref 5–15)
BUN: 17 mg/dL (ref 8–23)
BUN: 22 mg/dL (ref 8–23)
CO2: 30 mmol/L (ref 22–32)
CO2: 33 mmol/L — ABNORMAL HIGH (ref 22–32)
Calcium: 8.9 mg/dL (ref 8.9–10.3)
Calcium: 9.4 mg/dL (ref 8.9–10.3)
Chloride: 100 mmol/L (ref 98–111)
Chloride: 99 mmol/L (ref 98–111)
Creatinine, Ser: 1.33 mg/dL — ABNORMAL HIGH (ref 0.61–1.24)
Creatinine, Ser: 1.53 mg/dL — ABNORMAL HIGH (ref 0.61–1.24)
GFR, Estimated: 52 mL/min — ABNORMAL LOW (ref >60)
GFR, Estimated: 44 mL/min — ABNORMAL LOW (ref >60)
Glucose, Bld: 193 mg/dL — ABNORMAL HIGH (ref 70–99)
Glucose, Bld: 144 mg/dL — ABNORMAL HIGH (ref 70–99)
Potassium: 2.6 mmol/L — CL (ref 3.5–5.1)
Potassium: 5.1 mmol/L (ref 3.5–5.1)
Sodium: 138 mmol/L (ref 135–145)
Sodium: 137 mmol/L (ref 135–145)

## 2022-04-02 LAB — CBC
HCT: 42.6 % (ref 39.0–52.0)
Hemoglobin: 13.5 g/dL (ref 13.0–17.0)
MCH: 27.6 pg (ref 26.0–34.0)
MCHC: 31.7 g/dL (ref 30.0–36.0)
MCV: 87.1 fL (ref 80.0–100.0)
Platelets: 133 10*3/uL — ABNORMAL LOW (ref 150–400)
RBC: 4.89 MIL/uL (ref 4.22–5.81)
RDW: 15.3 % (ref 11.5–15.5)
WBC: 5.6 10*3/uL (ref 4.0–10.5)
nRBC: 0 % (ref 0.0–0.2)

## 2022-04-02 LAB — GLUCOSE, CAPILLARY
Glucose-Capillary: 205 mg/dL — ABNORMAL HIGH (ref 70–99)
Glucose-Capillary: 298 mg/dL — ABNORMAL HIGH (ref 70–99)

## 2022-04-02 MED ORDER — CEPHALEXIN 500 MG PO CAPS: 500.0000 mg | ORAL_CAPSULE | Freq: Four times a day (QID) | ORAL | 0 refills | Status: AC

## 2022-04-02 MED ORDER — POTASSIUM CHLORIDE 10 MEQ/100ML IV SOLN
10.0000 meq | INTRAVENOUS | Status: AC
  Administered 2022-04-02 (×4): 10 meq via INTRAVENOUS
  Filled 2022-04-02 (×2): qty 100

## 2022-04-02 MED ORDER — POTASSIUM CHLORIDE CRYS ER 20 MEQ PO TBCR: 20.0000 meq | EXTENDED_RELEASE_TABLET | Freq: Every day | ORAL | 0 refills | Status: AC

## 2022-04-02 MED ORDER — CEPHALEXIN 500 MG PO CAPS
500.0000 mg | ORAL_CAPSULE | Freq: Once | ORAL | Status: AC
  Administered 2022-04-03: 500 mg via ORAL
  Filled 2022-04-02: qty 1

## 2022-04-02 MED ORDER — POTASSIUM CHLORIDE CRYS ER 20 MEQ PO TBCR
40.0000 meq | EXTENDED_RELEASE_TABLET | ORAL | Status: AC
  Administered 2022-04-02 (×2): 40 meq via ORAL
  Filled 2022-04-02 (×2): qty 2

## 2022-04-02 MED ORDER — CHLORHEXIDINE GLUCONATE CLOTH 2 % EX PADS
6.0000 | MEDICATED_PAD | Freq: Every day | CUTANEOUS | Status: DC
  Administered 2022-04-02: 6 via TOPICAL

## 2022-04-02 MED ORDER — APIXABAN 5 MG PO TABS: 5.0000 mg | ORAL_TABLET | Freq: Two times a day (BID) | ORAL | Status: AC

## 2022-04-02 NOTE — ED Notes (Signed)
Bladder scan showed approximately 30 mls urine ?

## 2022-04-02 NOTE — Progress Notes (Signed)
SATURATION QUALIFICATIONS: (This note is used to comply with regulatory documentation for home oxygen) ? ?Patient Saturations on Room Air at Rest = 98% ? ?Patient Saturations on Room Air while Ambulating = 95% ? ?Patient ambulated without oxygen and states he feels good without it, no at home oxygen is required at this time.  ?

## 2022-04-02 NOTE — Discharge Summary (Signed)
Physician Discharge Summary  ?Rajat Bagent YCX:[id number] DOB: 04-Mar-1933 DOA: 03/31/2022 ? ?PCP: Angelique Blonder, MD ? ?Admit date: 03/31/2022 ?Discharge date: 04/02/2022 ? ?Admitted From: Home ?Disposition: Home ? ?Recommendations for Outpatient Follow-up:  ?Follow up with PCP in 1-2 weeks ?Please obtain BMP/CBC in one week ?Please follow up with cardiology as scheduled ? ?Home Health: None ?Equipment/Devices: None ? ?Discharge Condition: Stable ?CODE STATUS: Full ?Diet recommendation: Low-salt low-fat low-carb diet as discussed ? ?Brief/Interim Summary: ?Seth Hunt is a 88 y.o. male with medical history significant of with history of BPH, coronary artery disease, cardiomyopathy, COPD, diabetes mellitus without complication, presents to the ED with a chief complaint of dyspnea.  Initially only dyspneic with exertion now profoundly short of breath even at rest.  Unable to perform ADLs or ambulate more than a few feet before having symptoms.  Patient admitted for atypical chest pain, dyspnea with exertion concerning for heart failure exacerbation. ? ?Patient diuresed well currently back to baseline dry weight, no longer having any further symptoms or hypoxia with exertion.  Close follow-up outpatient with PCP and cardiology as discussed. ? ?Acute hypoxic respiratory failure, likely multifactorial ?Acute on chronic systolic heart failure exacerbation ?-Consistent with heart failure exacerbation, resolved with diuretics essentially back to dry weight no longer hypoxic with exertion ?-Echo shows ongoing depressed systolic ejection fraction 40 to 45% ?  ?Atypical chest pain secondary to above ?-CTA shows no PE ?-EKG without ischemic changes ?-Troponin flat in the 30s, not consistent with ACS ?-Questionably GI related, see below ?  ?Gastroesophageal reflux disease ?- Continue Protonix -unclear of patient's atypical substernal or epigastric pain is GERD related, continue supportive care and follow clinically ?   ?Chronic systolic HF (heart failure) (HCC) ?-Transition back to home medications as below, discussed need for dietary and lifestyle compliance ?  ?New onset/newly diagnosed A-fib ?-Questionably provoked secondary to above ?-Currently rate controlled on amiodarone appears to be in sinus with frequent PVCs on telemetry ?-Initiated on Eliquis at intake ?-TSH within normal limits ?  ?Troponin level elevated, likely supply/demand mismatch given above ?-Initially 39, downtrending to 36 ?-No ischemic changes on EKG ?- likely due to hypertensive crisis ?-BP has responded well to hydralazine and now systolic 137 ?-Monitor on telemetry ?-No complaints of chest pain ?  ?Hypertensive crisis, resolved ?- Blood pressure as high as 201/108 ?-Secondary to nonadherence to medical regimen due to financial barriers regarding access to medication ?-Continue medications as below ?  ?DM (diabetes mellitus) (HCC), uncontrolled ?-A1c elevated at 7.8, discussed dietary changes and need for medication compliance  ? ?COPD (chronic obstructive pulmonary disease) (HCC) ?- Continue Singulair, Advair ?-Resume home inhalers ? ? ?Discharge Diagnoses:  ?Principal Problem: ?  Chest pain ?Active Problems: ?  COPD (chronic obstructive pulmonary disease) (HCC) ?  DM (diabetes mellitus) (HCC) ?  Hypertensive crisis ?  Troponin level elevated ?  Frequent PVCs ?  Chronic systolic HF (heart failure) (HCC) ?  Gastroesophageal reflux disease ? ? ? ?Discharge Instructions ? ?Discharge Instructions   ? ? Discharge patient   Complete by: As directed ?  ? Discharge disposition: 01-Home or Self Care  ? Discharge patient date: 04/02/2022  ? ?  ? ?Allergies as of 04/02/2022   ?No Known Allergies ?  ? ?  ?Medication List  ?  ? ?STOP taking these medications   ? ?guaiFENesin 600 MG 12 hr tablet ?Commonly known as: MUCINEX ?  ?predniSONE 20 MG tablet ?Commonly known as: DELTASONE ?  ? ?  ? ?TAKE these  medications   ? ?Advair HFA 230-21 MCG/ACT inhaler ?Generic drug:  fluticasone-salmeterol ?Inhale 2 puffs into the lungs 2 (two) times daily. ?  ?albuterol 108 (90 Base) MCG/ACT inhaler ?Commonly known as: VENTOLIN HFA ?Inhale 1-2 puffs into the lungs every 6 (six) hours as needed for wheezing or shortness of breath. ?  ?amiodarone 200 MG tablet ?Commonly known as: PACERONE ?Take 0.5 tablets (100 mg total) by mouth daily. ?  ?apixaban 5 MG Tabs tablet ?Commonly known as: ELIQUIS ?Take 1 tablet (5 mg total) by mouth 2 (two) times daily. ?  ?aspirin EC 81 MG tablet ?Take 81 mg by mouth daily. ?  ?FLUoxetine 20 MG capsule ?Commonly known as: PROZAC ?Take 20 mg by mouth every morning. ?  ?furosemide 40 MG tablet ?Commonly known as: LASIX ?Take 1 tablet (40 mg total) by mouth daily. ?  ?glimepiride 4 MG tablet ?Commonly known as: AMARYL ?Take 4 mg by mouth daily. ?  ?losartan 25 MG tablet ?Commonly known as: COZAAR ?Take 1 tablet (25 mg total) by mouth daily. ?  ?montelukast 10 MG tablet ?Commonly known as: SINGULAIR ?Take 10 mg by mouth daily. ?  ?pantoprazole 40 MG tablet ?Commonly known as: Protonix ?Take 1 tablet (40 mg total) by mouth daily. ?  ?potassium chloride SA 20 MEQ tablet ?Commonly known as: KLOR-CON M ?Take 1 tablet (20 mEq total) by mouth daily. ?  ?simvastatin 10 MG tablet ?Commonly known as: ZOCOR ?Take 1 tablet (10 mg total) by mouth at bedtime. ?  ?tamsulosin 0.4 MG Caps capsule ?Commonly known as: FLOMAX ?Take 0.4 mg by mouth every morning. ?  ? ?  ? ? ?No Known Allergies ? ?Consultations: ?None ? ?Procedures/Studies: ?DG Chest 2 View ? ?Result Date: 03/31/2022 ?CLINICAL DATA:  Chest pain. EXAM: CHEST - 2 VIEW COMPARISON:  Chest x-ray 01/01/2022. FINDINGS: There is a new small to moderate-sized right pleural effusion with right basilar patchy opacities. There are also new linear and patchy opacities in the right mid lung. There is no pneumothorax. Cardiomediastinal silhouette is within normal limits. IMPRESSION: 1. New small to moderate-sized right pleural effusion  with right mid and lower lung airspace disease. Electronically Signed   By: Darliss Cheney M.D.   On: 03/31/2022 22:04  ? ?CT Angio Chest PE W and/or Wo Contrast ? ?Result Date: 03/31/2022 ?CLINICAL DATA:  Chest pain, dyspnea, elevated D-dimer EXAM: CT ANGIOGRAPHY CHEST WITH CONTRAST TECHNIQUE: Multidetector CT imaging of the chest was performed using the standard protocol during bolus administration of intravenous contrast. Multiplanar CT image reconstructions and MIPs were obtained to evaluate the vascular anatomy. RADIATION DOSE REDUCTION: This exam was performed according to the departmental dose-optimization program which includes automated exposure control, adjustment of the mA and/or kV according to patient size and/or use of iterative reconstruction technique. CONTRAST:  OMNIPAQUE IOHEXOL 350 MG/ML SOLN COMPARISON:  None. FINDINGS: Cardiovascular: There is adequate opacification of the pulmonary arterial tree. There is non opacification of the segmental pulmonary arteries of the anterior and lateral segments of the right lower lobe related to collapse of the segments with resultant pulmonary vascular redistribution. No intraluminal filling defect identified to suggest acute pulmonary embolism. The central pulmonary arteries are of normal caliber. Cardiac size is within normal limits. Moderate coronary artery calcification. No pericardial effusion. Moderate atherosclerotic calcification within the thoracic aorta. No aortic aneurysm. Mediastinum/Nodes: No pathologic thoracic adenopathy. Esophagus unremarkable. Lungs/Pleura: Moderate centrilobular emphysema. Small left and moderate right pleural effusions are present with compressive atelectasis of the dependent lungs and  resultant subtotal collapse of the right lower lobe. No confluent pulmonary infiltrate. No central obstructing lesion. Bronchial wall thickening is present in keeping with airway inflammation. Upper Abdomen: No acute abnormality.  Cholecystectomy has been performed. Musculoskeletal: Osseous structures are age-appropriate. No remote right seventh rib fracture noted. No acute bone abnormality. Review of the MIP images confirms the above findings. IMP

## 2022-04-02 NOTE — Discharge Instructions (Signed)
Take the antibiotics as prescribed.  Follow-up with your doctor to be rechecked 

## 2022-04-02 NOTE — Progress Notes (Addendum)
Call from lab critical value Potassium 2.6. Notified MD. No new orders at this time.  ?

## 2022-04-02 NOTE — ED Triage Notes (Signed)
Pt seen for [facility] and was admitted 3 days ago, states he had a catheter inserted when here, was removed . Since going home, he has been incontinent of his urine.  ?

## 2022-04-02 NOTE — Progress Notes (Signed)
PT Cancellation Note ? ?Patient Details ?Name: Kimble Hitchens ?MRN: [id number] ?DOB: March 09, 1933 ? ? ?Cancelled Treatment:    Reason Eval/Treat Not Completed: PT screened, no needs identified, will sign off.  Patient transferring and ambulating with nursing staff without problem. ? ? ?2:56 PM, 04/02/22 ?Ocie Bob, MPT ?Physical Therapist with [facility] ?[facility] ?[phone] office ?[phone] mobile phone ? ?

## 2022-04-02 NOTE — ED Provider Notes (Signed)
?[facility] EMERGENCY DEPARTMENT ?Provider Note ? ? ?CSN: [id number] ?Arrival date & time: 04/02/22  1925 ? ?  ? ?History ? ?Chief Complaint  ?Patient presents with  ? Groin Pain  ?  Urine incontinence  ? ? ?Seth Hunt is a 88 y.o. male. ? ? ?Groin Pain ? ? ?Patient has history of COPD, diabetes, BPH, coronary artery disease who presents to the ED with complaints of urinary incontinence.  Patient states he noticed today the urine was dribbling out.  He is not having any ocular pain.  Has not had any abdominal pain he denies any fevers or chills. ?Prior records reviewed and the patient was actually just discharged from the hospital today after being admitted on April 18.  Patient was treated for CHF exacerbation.  He was given diuresis.  Patient states he noticed a little bit of incontinence when he was leaving the hospital today.  He came back to be rechecked as he does not usually have incontinent issues.  He thinks it could have been related to a catheter ?Home Medications ?Prior to Admission medications   ?Medication Sig Start Date End Date Taking? Authorizing Provider  ?cephALEXin (KEFLEX) 500 MG capsule Take 1 capsule (500 mg total) by mouth 4 (four) times daily. 04/02/22  Yes Linwood Dibbles, MD  ?albuterol (VENTOLIN HFA) 108 (90 Base) MCG/ACT inhaler Inhale 1-2 puffs into the lungs every 6 (six) hours as needed for wheezing or shortness of breath. ?Patient not taking: Reported on 04/02/2022 01/03/22   Vassie Loll, MD  ?amiodarone (PACERONE) 200 MG tablet Take 0.5 tablets (100 mg total) by mouth daily. ?Patient not taking: Reported on 04/02/2022 06/29/16   Marily Lente, NP  ?apixaban (ELIQUIS) 5 MG TABS tablet Take 1 tablet (5 mg total) by mouth 2 (two) times daily. ?Patient not taking: Reported on 04/02/2022 04/02/22   Azucena Fallen, MD  ?fluticasone-salmeterol (ADVAIR HFA) [phone] MCG/ACT inhaler Inhale 2 puffs into the lungs 2 (two) times daily. ?Patient not taking: Reported on 04/02/2022 01/03/22    Vassie Loll, MD  ?furosemide (LASIX) 40 MG tablet Take 1 tablet (40 mg total) by mouth daily. ?Patient not taking: Reported on 04/02/2022 05/04/17   Duke Salvia, MD  ?losartan (COZAAR) 25 MG tablet Take 1 tablet (25 mg total) by mouth daily. ?Patient not taking: Reported on 04/02/2022 09/02/15   Duke Salvia, MD  ?pantoprazole (PROTONIX) 40 MG tablet Take 1 tablet (40 mg total) by mouth daily. ?Patient not taking: Reported on 04/02/2022 01/03/22 01/03/23  Vassie Loll, MD  ?potassium chloride SA (KLOR-CON M) 20 MEQ tablet Take 1 tablet (20 mEq total) by mouth daily. ?Patient not taking: Reported on 04/02/2022 04/02/22   Azucena Fallen, MD  ?simvastatin (ZOCOR) 10 MG tablet Take 1 tablet (10 mg total) by mouth at bedtime. ?Patient not taking: Reported on 04/02/2022 01/02/14   Cathren Harsh, MD  ?   ? ?Allergies    ?Patient has no known allergies.   ? ?Review of Systems   ?Review of Systems  ?Constitutional:  Negative for fever.  ? ?Physical Exam ?Updated Vital Signs ?BP (!) 146/90   Pulse (!) 49   Resp 18   Ht 1.727 m (5\' 8" )   Wt 67.2 kg   SpO2 97%   BMI 22.53 kg/m?  ?Physical Exam ?Vitals and nursing note reviewed.  ?Constitutional:   ?   General: He is not in acute distress. ?   Appearance: He is well-developed.  ?HENT:  ?  Head: Normocephalic and atraumatic.  ?   Right Ear: External ear normal.  ?   Left Ear: External ear normal.  ?Eyes:  ?   General: No scleral icterus.    ?   Right eye: No discharge.     ?   Left eye: No discharge.  ?   Conjunctiva/sclera: Conjunctivae normal.  ?Neck:  ?   Trachea: No tracheal deviation.  ?Cardiovascular:  ?   Rate and Rhythm: Normal rate and regular rhythm.  ?Pulmonary:  ?   Effort: Pulmonary effort is normal. No respiratory distress.  ?   Breath sounds: Normal breath sounds. No stridor. No wheezing or rales.  ?Abdominal:  ?   General: Bowel sounds are normal. There is no distension.  ?   Palpations: Abdomen is soft.  ?   Tenderness: There is no abdominal  tenderness. There is no guarding or rebound.  ?Musculoskeletal:     ?   General: No tenderness or deformity.  ?   Cervical back: Neck supple.  ?Skin: ?   General: Skin is warm and dry.  ?   Findings: No rash.  ?Neurological:  ?   General: No focal deficit present.  ?   Mental Status: He is alert.  ?   Cranial Nerves: No cranial nerve deficit (no facial droop, extraocular movements intact, no slurred speech).  ?   Sensory: No sensory deficit.  ?   Motor: No abnormal muscle tone or seizure activity.  ?   Coordination: Coordination normal.  ?Psychiatric:     ?   Mood and Affect: Mood normal.  ? ? ?ED Results / Procedures / Treatments   ?Labs ?(all labs ordered are listed, but only abnormal results are displayed) ?Labs Reviewed  ?URINALYSIS, ROUTINE W REFLEX MICROSCOPIC - Abnormal; Notable for the following components:  ?    Result Value  ? APPearance HAZY (*)   ? Hgb urine dipstick LARGE (*)   ? Protein, ur 30 (*)   ? Leukocytes,Ua TRACE (*)   ? RBC / HPF >50 (*)   ? Bacteria, UA RARE (*)   ? All other components within normal limits  ?BASIC METABOLIC PANEL - Abnormal; Notable for the following components:  ? CO2 33 (*)   ? Glucose, Bld 144 (*)   ? Creatinine, Ser 1.53 (*)   ? GFR, Estimated 44 (*)   ? All other components within normal limits  ?CBC - Abnormal; Notable for the following components:  ? Platelets 133 (*)   ? All other components within normal limits  ?URINE CULTURE  ? ? ?EKG ?None ? ?Radiology ?No results found. ? ?Procedures ?Procedures  ? ? ?Medications Ordered in ED ?Medications  ?cephALEXin (KEFLEX) capsule 500 mg (has no administration in time range)  ? ? ?ED Course/ Medical Decision Making/ A&P ?Clinical Course as of 04/02/22 2329  ?Thu Apr 02, 2022  ?1947 Bladder scan showed only 30 cc of urine [JK]  ?2100 Basic metabolic panel(!) ?Creatinine slightly increased compared to previous value [JK]  ?2100 CBC(!) ?Normal [JK]  ?  ?Clinical Course User Index ?[JK] Linwood Dibbles, MD  ? ?                         ?Medical Decision Making ?Amount and/or Complexity of Data Reviewed ?Labs: ordered. Decision-making details documented in ED Course. ? ?Risk ?Prescription drug management. ? ? ?Patient presented to emergency room with complaints of urinary discomfort.  Patient was recently in the  hospital.  Patient thinks he may have been catheterized.  No signs of urinary retention.  No signs of acute kidney injury.  Patient urinalysis does show hematuria and he is having some urinary frequency and dysuria.  No signs of systemic infection.  I will start him on a course of antibiotics. ? ? ? ? ? ? ? ?Final Clinical Impression(s) / ED Diagnoses ?Final diagnoses:  ?Acute cystitis with hematuria  ? ? ?Rx / DC Orders ?ED Discharge Orders   ? ?      Ordered  ?  cephALEXin (KEFLEX) 500 MG capsule  4 times daily       ? 04/02/22 2329  ? ?  ?  ? ?  ? ? ?  ?Linwood Dibbles, MD ?04/02/22 2332 ? ?

## 2022-04-03 NOTE — ED Notes (Signed)
Called daughter's number in chart, granddaughter's number [phone] with no success in reference to picking patient up. Spoke with Judeth Cornfield at [facility] whom recommends our SW to get involved and should she not be able to resolve situation contact them. All family members contact information and address provided to [place]. Patient is alert, not oriented. Police notified granddaughter this am around [phone] of patient discharge and need to be picked up. MD aware and SW consult order placed.  ?

## 2022-04-03 NOTE — ED Notes (Signed)
Spoke with patient's step daughter, Elspeth Cho.  She will call her daughter to come pick up patient.   ?

## 2022-04-03 NOTE — Progress Notes (Signed)
TOC CSW  spoke with pt's stepdaughter Mandie [phone]), she requested transportation for pt home. She stated there is only one car where pt resides and it will not be available until after 6 pm today. Mandie stated there will be someone in the home to receive pt when he is dropped off. CSW will arrange [facility] transport for pt d/c. ? ?Valentina Shaggy.Cherlyn Syring, MSW, LCSWA ?[facility]  Transitions of Care ?Clinical Social Worker I ?Direct Dial: [phone]  Fax: [phone] ?[email or web address]  ? ?

## 2022-04-03 NOTE — ED Notes (Signed)
Attempted to call pt's family members, unsuccessful.  No VM set up to leave message. ?

## 2022-04-03 NOTE — ED Notes (Signed)
Attempted to call family for pt, both numbers unsuccessful.  Pt unable to remember phone numbers.  ?

## 2022-04-03 NOTE — ED Notes (Signed)
Pt's granddaughter has not arrived, attempted calling again with no answer.  ?

## 2022-04-03 NOTE — ED Notes (Signed)
Pt's granddaughter is on the way to pick patient up. ?

## 2022-04-03 NOTE — ED Notes (Signed)
Spoke with patient's daughter, Elnita Maxwell, who lives out of town. States pt lives with her daughter, Willow Ora, but she is not answering her phone. RN attempted to call Lelon Mast, number not in service at this time. Arranging well-fare check. Awaiting call back from law enforcement. ?

## 2022-04-04 LAB — URINE CULTURE: Culture: NO GROWTH

## 2022-06-13 DEATH — deceased
